# Patient Record
Sex: Female | Born: 1955 | Race: White | Hispanic: No | Marital: Married | Smoking: Never smoker
Health system: Southern US, Community
[De-identification: ages and names within clinical notes are randomized; demographics above are authoritative.]

## PROBLEM LIST (undated history)

## (undated) DIAGNOSIS — F329 Major depressive disorder, single episode, unspecified: Secondary | ICD-10-CM

## (undated) DIAGNOSIS — I2510 Atherosclerotic heart disease of native coronary artery without angina pectoris: Secondary | ICD-10-CM

## (undated) DIAGNOSIS — R030 Elevated blood-pressure reading, without diagnosis of hypertension: Secondary | ICD-10-CM

## (undated) DIAGNOSIS — E669 Obesity, unspecified: Secondary | ICD-10-CM

## (undated) DIAGNOSIS — F32A Depression, unspecified: Secondary | ICD-10-CM

## (undated) DIAGNOSIS — F419 Anxiety disorder, unspecified: Secondary | ICD-10-CM

## (undated) DIAGNOSIS — E785 Hyperlipidemia, unspecified: Secondary | ICD-10-CM

## (undated) DIAGNOSIS — K219 Gastro-esophageal reflux disease without esophagitis: Secondary | ICD-10-CM

## (undated) HISTORY — DX: Anxiety disorder, unspecified: F41.9

## (undated) HISTORY — PX: ABDOMINAL HYSTERECTOMY: SHX81

## (undated) HISTORY — DX: Gastro-esophageal reflux disease without esophagitis: K21.9

---

## 2000-10-23 ENCOUNTER — Ambulatory Visit (HOSPITAL_COMMUNITY): Admission: RE | Admit: 2000-10-23 | Discharge: 2000-10-23 | Payer: Self-pay | Admitting: *Deleted

## 2000-10-23 ENCOUNTER — Encounter: Payer: Self-pay | Admitting: *Deleted

## 2000-10-23 ENCOUNTER — Other Ambulatory Visit: Admission: RE | Admit: 2000-10-23 | Discharge: 2000-10-23 | Payer: Self-pay | Admitting: *Deleted

## 2002-02-12 ENCOUNTER — Ambulatory Visit (HOSPITAL_COMMUNITY): Admission: RE | Admit: 2002-02-12 | Discharge: 2002-02-12 | Payer: Self-pay | Admitting: *Deleted

## 2002-02-12 ENCOUNTER — Encounter: Payer: Self-pay | Admitting: *Deleted

## 2003-10-20 ENCOUNTER — Ambulatory Visit (HOSPITAL_COMMUNITY): Admission: RE | Admit: 2003-10-20 | Discharge: 2003-10-20 | Payer: Self-pay | Admitting: *Deleted

## 2004-10-24 ENCOUNTER — Ambulatory Visit (HOSPITAL_COMMUNITY): Admission: RE | Admit: 2004-10-24 | Discharge: 2004-10-24 | Payer: Self-pay | Admitting: *Deleted

## 2006-01-24 ENCOUNTER — Ambulatory Visit (HOSPITAL_COMMUNITY): Admission: RE | Admit: 2006-01-24 | Discharge: 2006-01-24 | Payer: Self-pay | Admitting: Obstetrics and Gynecology

## 2007-01-31 ENCOUNTER — Ambulatory Visit (HOSPITAL_COMMUNITY): Admission: RE | Admit: 2007-01-31 | Discharge: 2007-01-31 | Payer: Self-pay | Admitting: Obstetrics and Gynecology

## 2008-02-14 ENCOUNTER — Ambulatory Visit (HOSPITAL_COMMUNITY): Admission: RE | Admit: 2008-02-14 | Discharge: 2008-02-14 | Payer: Self-pay | Admitting: Obstetrics and Gynecology

## 2009-02-24 ENCOUNTER — Ambulatory Visit (HOSPITAL_COMMUNITY): Admission: RE | Admit: 2009-02-24 | Discharge: 2009-02-24 | Payer: Self-pay | Admitting: Obstetrics and Gynecology

## 2009-02-24 IMAGING — MG MM DIGITAL SCREENING
4 series · 4 of 4 positions shown · non-contrast
Comparison: none

DG SCREEN MAMMOGRAM BILATERAL
Bilateral CC and MLO view(s) were taken.

DIGITAL SCREENING MAMMOGRAM WITH CAD:
There are scattered fibroglandular densities.  No masses or malignant type calcifications are 
identified.  Compared with prior studies.
Images were processed with CAD.

[L CC]
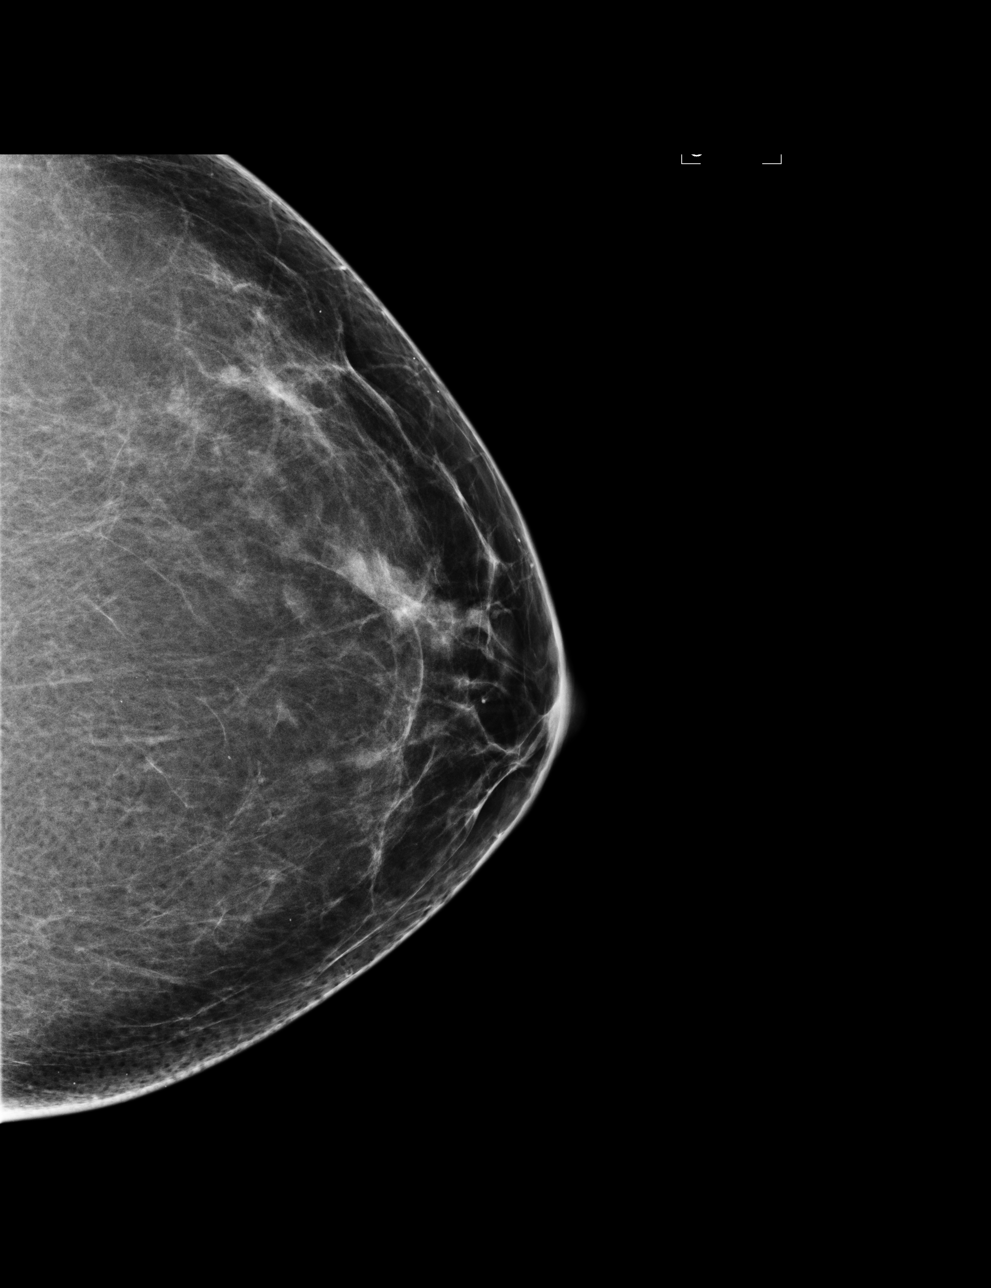

[L MLO]
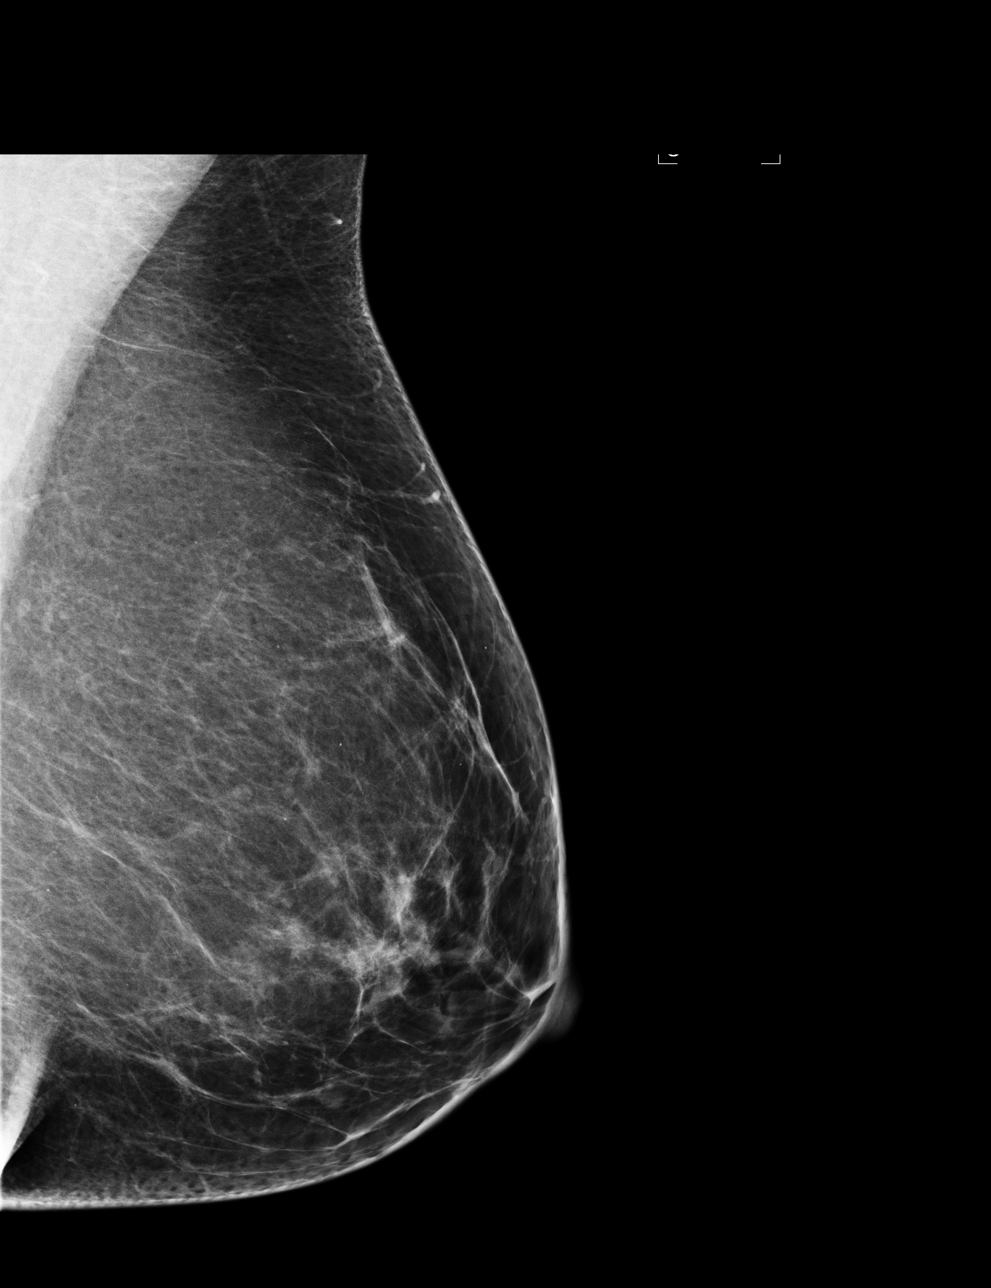

[R CC]
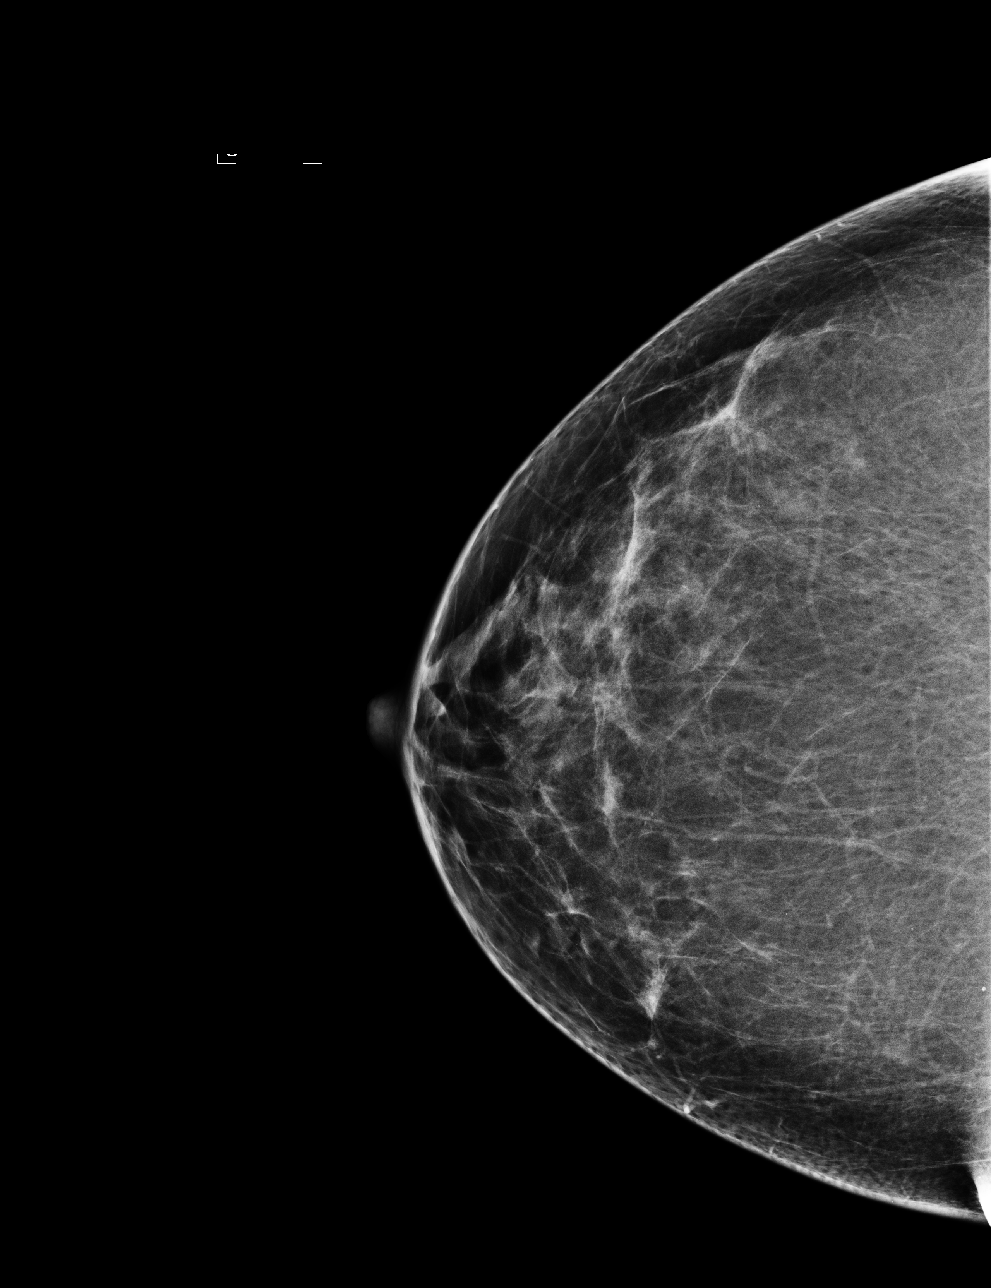

[R MLO]
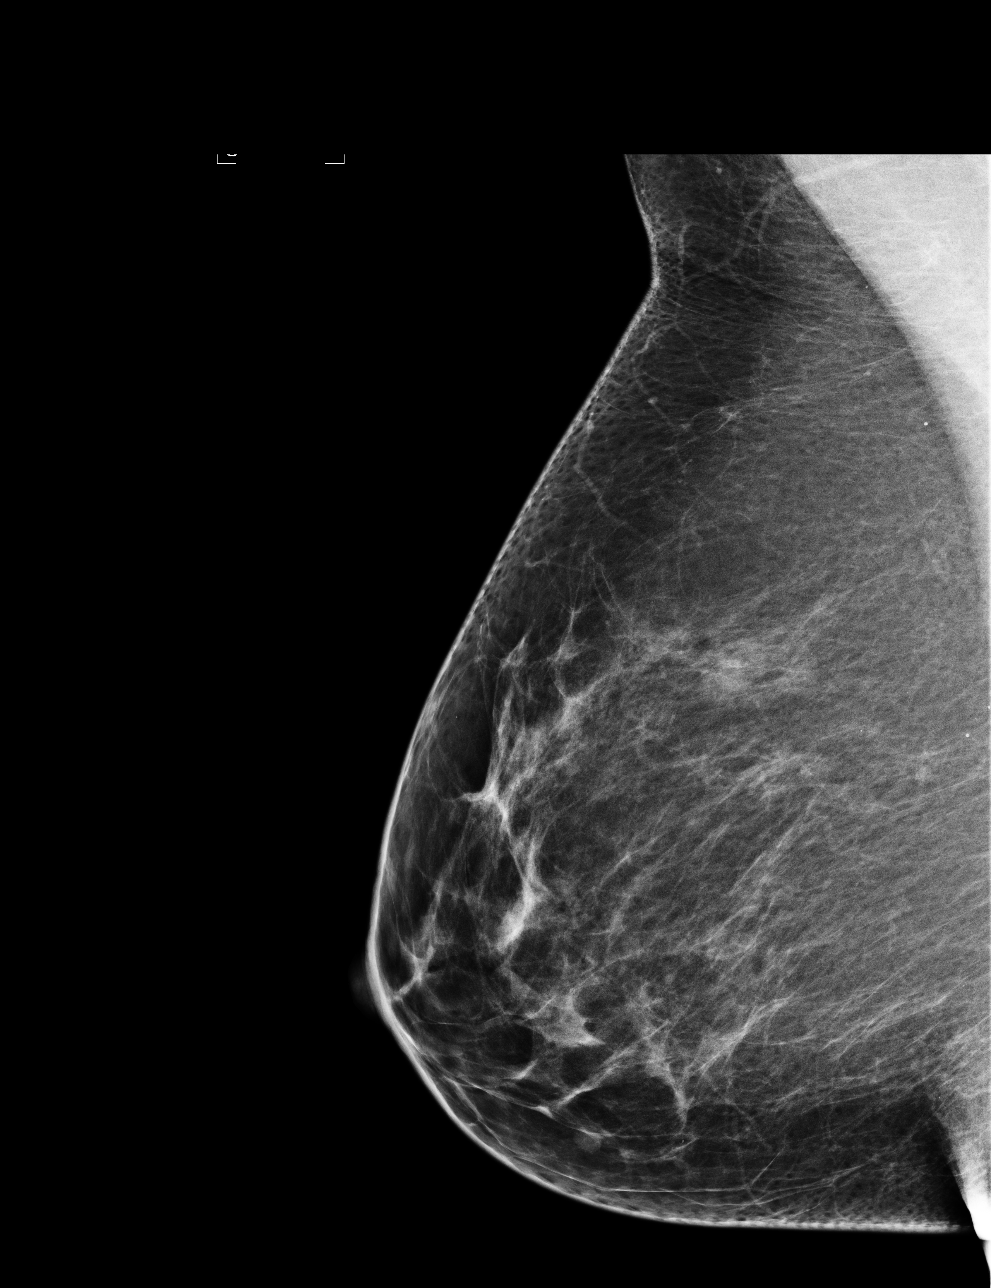

[4 of 4 positions shown; findings below may reference images not displayed]

IMPRESSION: No specific mammographic evidence of malignancy.  Next screening mammogram is recommended in one 
year.

A result letter of this screening mammogram will be mailed directly to the patient.

ASSESSMENT: Negative - BI-RADS 1

Screening mammogram in 1 year.
,

## 2010-03-04 ENCOUNTER — Ambulatory Visit (HOSPITAL_COMMUNITY)
Admission: RE | Admit: 2010-03-04 | Discharge: 2010-03-04 | Payer: Self-pay | Source: Home / Self Care | Attending: Obstetrics and Gynecology | Admitting: Obstetrics and Gynecology

## 2010-04-17 ENCOUNTER — Encounter: Payer: Self-pay | Admitting: Obstetrics and Gynecology

## 2011-01-23 ENCOUNTER — Other Ambulatory Visit (HOSPITAL_COMMUNITY): Payer: Self-pay | Admitting: Obstetrics and Gynecology

## 2011-01-23 DIAGNOSIS — Z139 Encounter for screening, unspecified: Secondary | ICD-10-CM

## 2011-03-09 ENCOUNTER — Ambulatory Visit (HOSPITAL_COMMUNITY)
Admission: RE | Admit: 2011-03-09 | Discharge: 2011-03-09 | Disposition: A | Discharge status: Home / Self Care | Payer: BC Managed Care – PPO | Source: Ambulatory Visit | Attending: Obstetrics and Gynecology | Admitting: Obstetrics and Gynecology

## 2011-03-09 DIAGNOSIS — Z139 Encounter for screening, unspecified: Secondary | ICD-10-CM

## 2011-03-09 DIAGNOSIS — Z1231 Encounter for screening mammogram for malignant neoplasm of breast: Secondary | ICD-10-CM | POA: Insufficient documentation

## 2011-07-05 ENCOUNTER — Encounter (HOSPITAL_COMMUNITY): Payer: Self-pay | Admitting: *Deleted

## 2011-07-05 ENCOUNTER — Emergency Department (HOSPITAL_COMMUNITY)
Admission: EM | Admit: 2011-07-05 | Discharge: 2011-07-05 | Disposition: A | Discharge status: Home / Self Care | Payer: BC Managed Care – PPO | Attending: Emergency Medicine | Admitting: Emergency Medicine

## 2011-07-05 DIAGNOSIS — M549 Dorsalgia, unspecified: Secondary | ICD-10-CM

## 2011-07-05 MED ORDER — HYDROCODONE-ACETAMINOPHEN 5-325 MG PO TABS: 1.0000 | ORAL_TABLET | Freq: Four times a day (QID) | ORAL | Status: AC | PRN

## 2011-07-05 NOTE — ED Provider Notes (Signed)
History     CSN: [id number]  Arrival date & time 07/05/11  1402   First MD Initiated Contact with Patient 07/05/11 1438      Chief Complaint  Patient presents with  . Back Pain    (Consider location/radiation/quality/duration/timing/severity/associated sxs/prior treatment) Patient is a 56 y.o. female presenting with back pain. The history is provided by the patient.  Back Pain  This is a new problem. The current episode started more than 2 days ago. The problem occurs constantly. The problem has not changed since onset.The pain is associated with no known injury. The pain is present in the sacro-iliac joint. The quality of the pain is described as stabbing. The pain does not radiate. The pain is at a severity of 5/10. The pain is moderate. The symptoms are aggravated by bending and certain positions. Pertinent negatives include no chest pain, no fever, no numbness, no headaches, no abdominal pain, no bowel incontinence, no perianal numbness, no bladder incontinence, no dysuria, no leg pain, no paresthesias, no paresis, no tingling and no weakness.   Patient with history of similar back problems in the past as a receiving her primary care provider and treated with a prednisone and Flexeril and currently now taking Motrin at their recommendation. Patient has not had an MRI or x-rays of the back in the past with symptoms of only been present for about one week.    History reviewed. No pertinent past medical history.  Past Surgical History  Procedure Date  . Abdominal hysterectomy     History reviewed. No pertinent family history.  History  Substance Use Topics  . Smoking status: Never Smoker   . Smokeless tobacco: Not on file  . Alcohol Use: No    OB History    Grav Para Term Preterm Abortions TAB SAB Ect Mult Living                  Review of Systems  Constitutional: Negative for fever.  HENT: Negative for neck pain.   Eyes: Negative for visual disturbance.    Respiratory: Negative for shortness of breath.   Cardiovascular: Negative for chest pain.  Gastrointestinal: Negative for nausea, vomiting, abdominal pain and bowel incontinence.  Genitourinary: Negative for bladder incontinence and dysuria.  Musculoskeletal: Positive for back pain.  Skin: Negative for rash.  Neurological: Negative for tingling, weakness, numbness, headaches and paresthesias.  Hematological: Does not bruise/bleed easily.    Allergies  Review of patient's allergies indicates no known allergies.  Home Medications   Current Outpatient Rx  Name Route Sig Dispense Refill  . CITALOPRAM HYDROBROMIDE 20 MG PO TABS Oral Take 20 mg by mouth at bedtime.    . CYCLOBENZAPRINE HCL 10 MG PO TABS Oral Take 10 mg by mouth 3 (three) times daily as needed. Muscle Spasms    . IBUPROFEN 800 MG PO TABS Oral Take 800 mg by mouth every 8 (eight) hours as needed. Pain    . NIACIN PO Oral Take 1 tablet by mouth daily.    Marland Kitchen FISH OIL 1000 MG PO CAPS Oral Take 2,000 mg by mouth daily.    Marland Kitchen SIMVASTATIN 20 MG PO TABS Oral Take 20 mg by mouth every evening.    Marland Kitchen HYDROCODONE-ACETAMINOPHEN 5-325 MG PO TABS Oral Take 1-2 tablets by mouth every 6 (six) hours as needed for pain. 14 tablet 0    BP 109/73  Pulse 90  Temp(Src) 97.3 F (36.3 C) (Oral)  Resp 20  Ht 5\' 4"  (1.626 m)  Wt 170 lb (77.111 kg)  BMI 29.18 kg/m2  SpO2 99%  Physical Exam  Nursing note and vitals reviewed. Constitutional: She is oriented to person, place, and time. She appears well-developed and well-nourished. No distress.  HENT:  Head: Normocephalic and atraumatic.  Mouth/Throat: Oropharynx is clear and moist.  Eyes: Conjunctivae and EOM are normal. Pupils are equal, round, and reactive to light.  Neck: Normal range of motion. Neck supple.  Cardiovascular: Normal rate, regular rhythm and normal heart sounds.   No murmur heard. Pulmonary/Chest: Effort normal and breath sounds normal. No respiratory distress.   Abdominal: Soft. Bowel sounds are normal. There is no tenderness.  Musculoskeletal: Normal range of motion. She exhibits no edema and no tenderness.  Neurological: She is alert and oriented to person, place, and time. No cranial nerve deficit. She exhibits normal muscle tone. Coordination normal.  Skin: Skin is warm. No rash noted. She is not diaphoretic.    ED Course  Procedures (including critical care time)  Labs Reviewed - No data to display No results found.   1. Back pain       MDM  Symptoms seem to be consistent with musculoskeletal right-sided low back pain no neuro or focal deficits. Patient started and treated with prednisone currently taking Motrin and Flexeril. Symptoms not resolving has had similar symptoms in the past that have resolved with prednisone. Will add hydrocodone as needed for pain relief. Patient will follow up with primary care provider if symptoms not improving mostly concerned because it daughter has a wedding in 2 weeks and wants to be better recommend she be off her feet and rest as much is possible this week. Minimize any work as much as possible.        Shelda Jakes, MD 07/05/11 [phone]

## 2011-07-05 NOTE — ED Notes (Signed)
Pain rt side of back, seen by MD and given meds but no better.

## 2011-07-05 NOTE — Discharge Instructions (Signed)
Take pain medicine as directed. May take this along with your Flexeril and Motrin. Rest as much as possible. Followup with your primary care doctor in the next few days if not better. Return for any new or worse symptoms.   Back Pain, Adult Low back pain is very common. About 1 in 5 people have back pain.The cause of low back pain is rarely dangerous. The pain often gets better over time.About half of people with a sudden onset of back pain feel better in just 2 weeks. About 8 in 10 people feel better by 6 weeks.  CAUSES Some common causes of back pain include:  Strain of the muscles or ligaments supporting the spine.   Wear and tear (degeneration) of the spinal discs.   Arthritis.   Direct injury to the back.  DIAGNOSIS Most of the time, the direct cause of low back pain is not known.However, back pain can be treated effectively even when the exact cause of the pain is unknown.Answering your caregiver's questions about your overall health and symptoms is one of the most accurate ways to make sure the cause of your pain is not dangerous. If your caregiver needs more information, he or she may order lab work or imaging tests (X-rays or MRIs).However, even if imaging tests show changes in your back, this usually does not require surgery. HOME CARE INSTRUCTIONS For many people, back pain returns.Since low back pain is rarely dangerous, it is often a condition that people can learn to [facility] their own.   Remain active. It is stressful on the back to sit or stand in one place. Do not sit, drive, or stand in one place for more than 30 minutes at a time. Take short walks on level surfaces as soon as pain allows.Try to increase the length of time you walk each day.   Do not stay in bed.Resting more than 1 or 2 days can delay your recovery.   Do not avoid exercise or work.Your body is made to move.It is not dangerous to be active, even though your back may hurt.Your back will likely heal  faster if you return to being active before your pain is gone.   Pay attention to your body when you bend and lift. Many people have less discomfortwhen lifting if they bend their knees, keep the load close to their bodies,and avoid twisting. Often, the most comfortable positions are those that put less stress on your recovering back.   Find a comfortable position to sleep. Use a firm mattress and lie on your side with your knees slightly bent. If you lie on your back, put a pillow under your knees.   Only take over-the-counter or prescription medicines as directed by your caregiver. Over-the-counter medicines to reduce pain and inflammation are often the most helpful.Your caregiver may prescribe muscle relaxant drugs.These medicines help dull your pain so you can more quickly return to your normal activities and healthy exercise.   Put ice on the injured area.   Put ice in a plastic bag.   Place a towel between your skin and the bag.   Leave the ice on for 15 to 20 minutes, 3 to 4 times a day for the first 2 to 3 days. After that, ice and heat may be alternated to reduce pain and spasms.   Ask your caregiver about trying back exercises and gentle massage. This may be of some benefit.   Avoid feeling anxious or stressed.Stress increases muscle tension and can worsen back  pain.It is important to recognize when you are anxious or stressed and learn ways to manage it.Exercise is a great option.  SEEK MEDICAL CARE IF:  You have pain that is not relieved with rest or medicine.   You have pain that does not improve in 1 week.   You have new symptoms.   You are generally not feeling well.  SEEK IMMEDIATE MEDICAL CARE IF:   You have pain that radiates from your back into your legs.   You develop new bowel or bladder control problems.   You have unusual weakness or numbness in your arms or legs.   You develop nausea or vomiting.   You develop abdominal pain.   You feel faint.    Document Released: 03/13/2005 Document Revised: 03/02/2011 Document Reviewed: 08/01/2010 [facility] Patient Information 2012 [place], [place].

## 2011-07-05 NOTE — ED Notes (Signed)
Pain rt side low back.  Has seen Dr Sherwood Gambler  And treated, but no better.

## 2011-07-11 ENCOUNTER — Ambulatory Visit (HOSPITAL_COMMUNITY)
Admission: RE | Admit: 2011-07-11 | Discharge: 2011-07-11 | Disposition: A | Discharge status: Home / Self Care | Payer: BC Managed Care – PPO | Source: Ambulatory Visit | Attending: Family Medicine | Admitting: Family Medicine

## 2011-07-11 ENCOUNTER — Other Ambulatory Visit (HOSPITAL_COMMUNITY): Payer: Self-pay | Admitting: Family Medicine

## 2011-07-11 DIAGNOSIS — X58XXXA Exposure to other specified factors, initial encounter: Secondary | ICD-10-CM | POA: Insufficient documentation

## 2011-07-11 DIAGNOSIS — M545 Low back pain, unspecified: Secondary | ICD-10-CM | POA: Insufficient documentation

## 2011-07-11 DIAGNOSIS — M5137 Other intervertebral disc degeneration, lumbosacral region: Secondary | ICD-10-CM | POA: Insufficient documentation

## 2011-07-11 DIAGNOSIS — S335XXA Sprain of ligaments of lumbar spine, initial encounter: Secondary | ICD-10-CM | POA: Insufficient documentation

## 2011-07-11 DIAGNOSIS — M51379 Other intervertebral disc degeneration, lumbosacral region without mention of lumbar back pain or lower extremity pain: Secondary | ICD-10-CM | POA: Insufficient documentation

## 2011-07-11 IMAGING — CR DG LUMBAR SPINE 2-3V
3 series · 3 of 3 positions shown · non-contrast
Comparison: None.

CLINICAL DATA: Right lower back pain

LUMBAR SPINE - 2-3 VIEW

[view not recorded (1 of 3)]
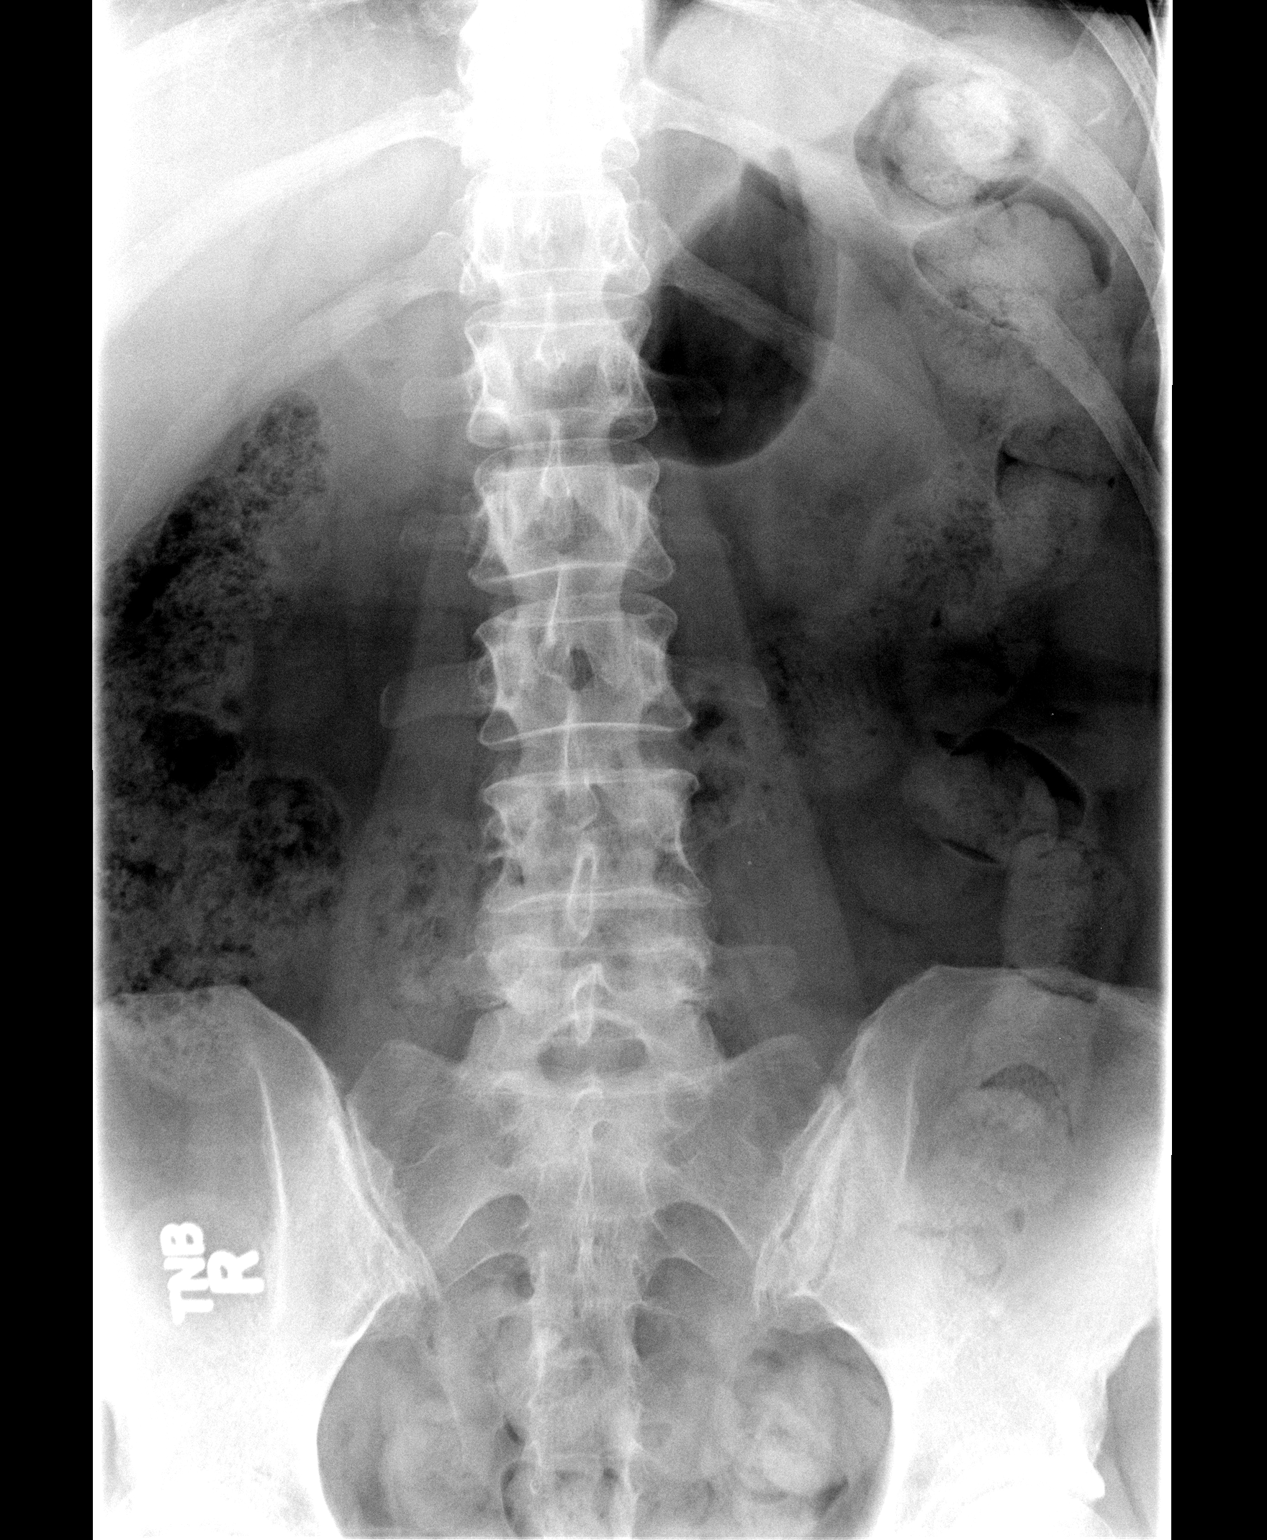

[view not recorded (2 of 3)]
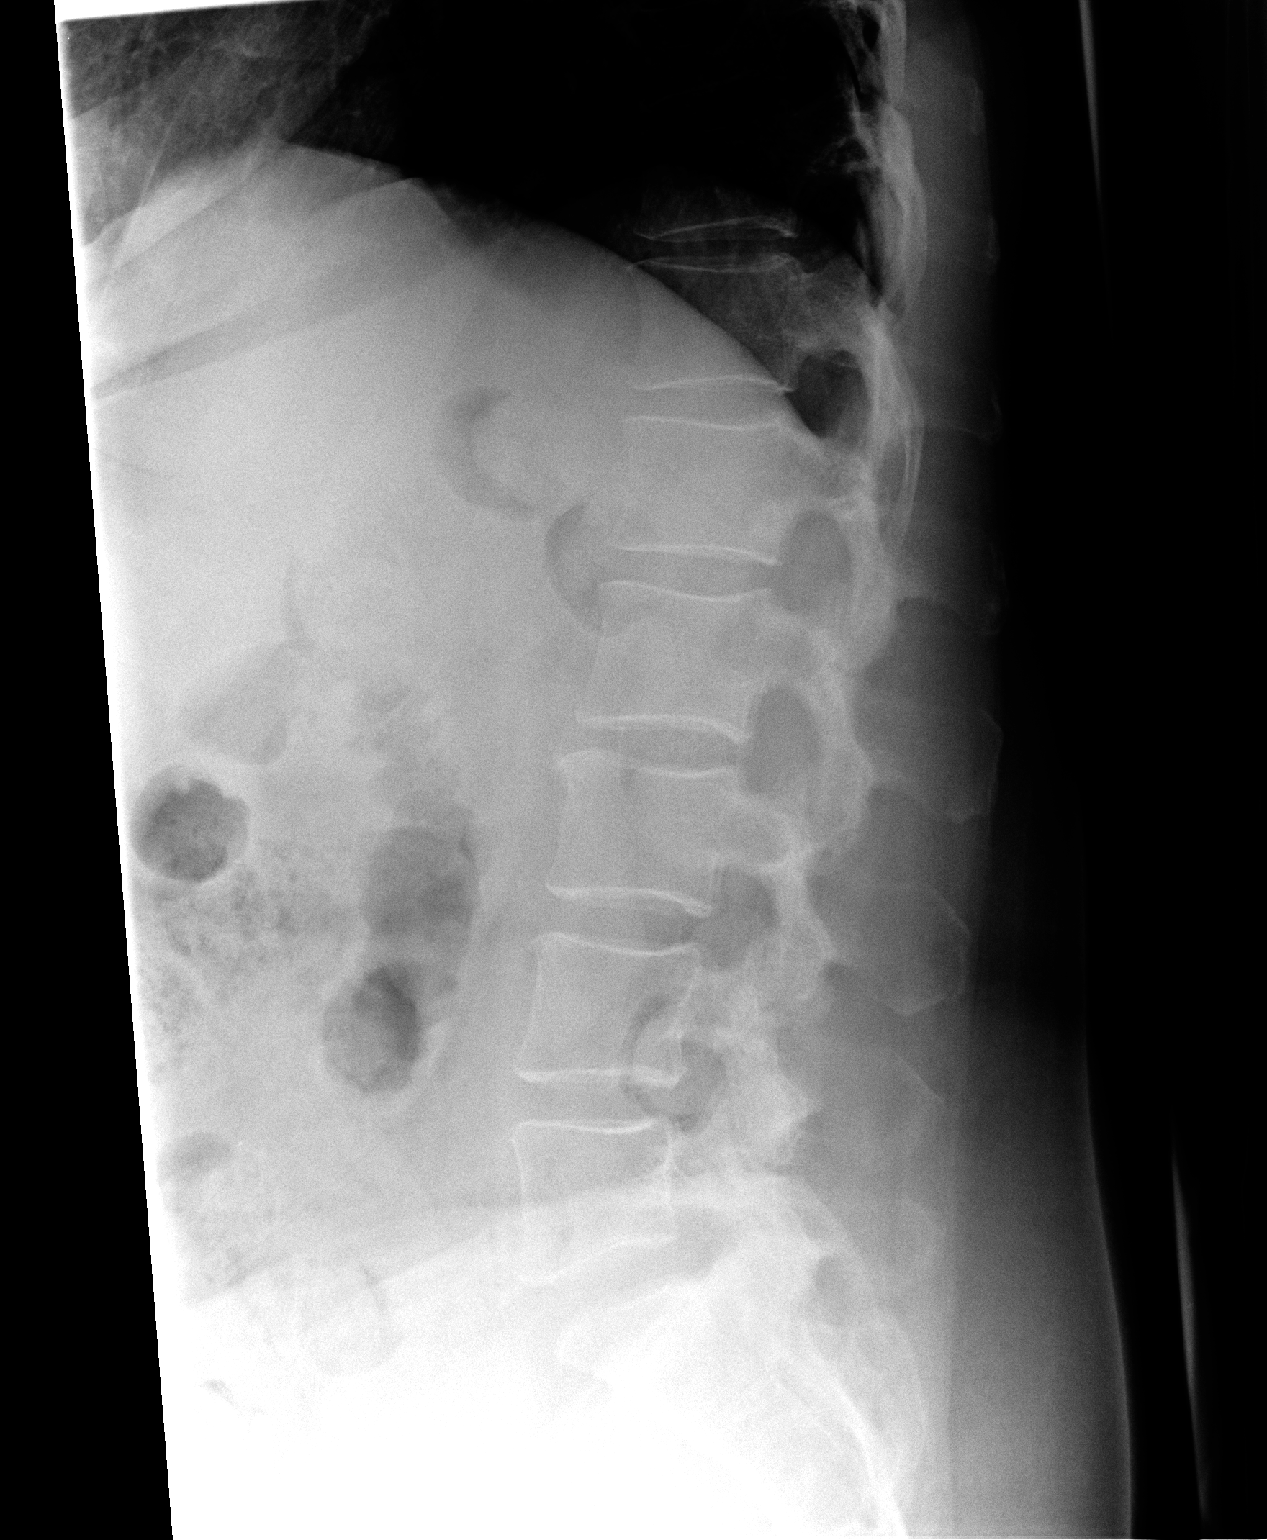

[view not recorded (3 of 3)]
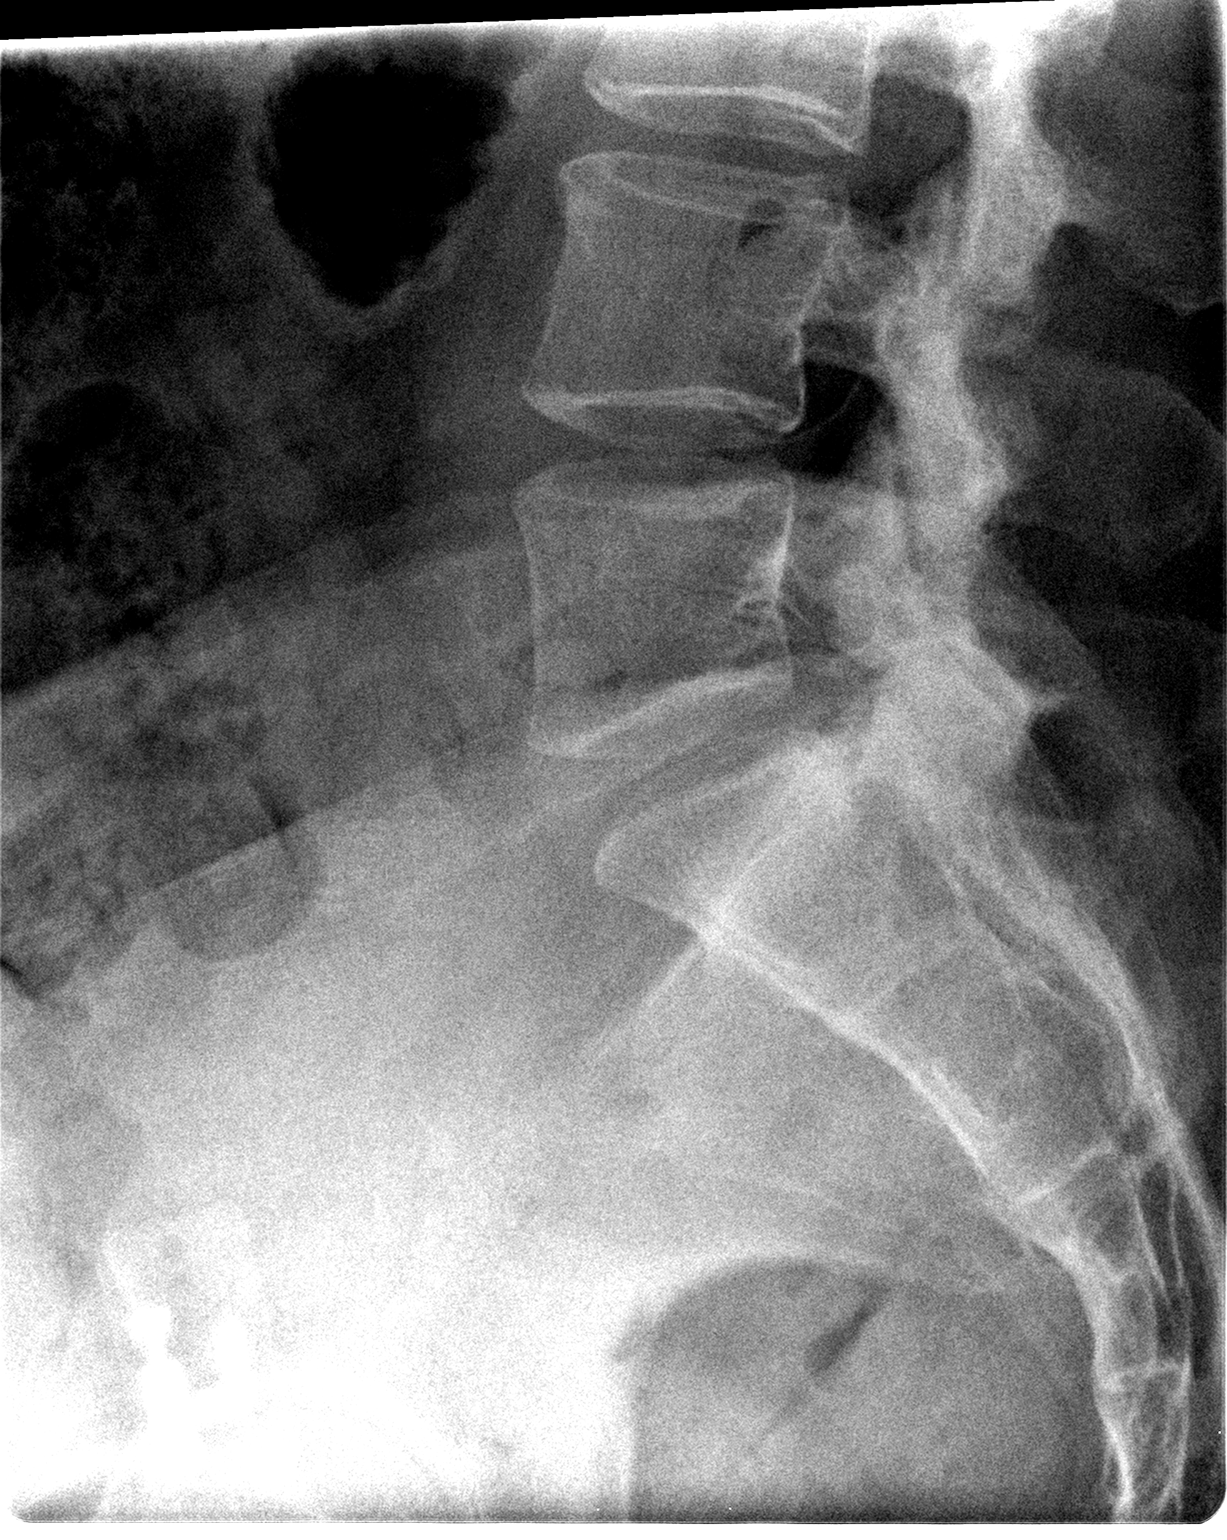

[3 of 3 positions shown; findings below may reference images not displayed]

FINDINGS: There are five non-rib bearing lumbar type vertebral
bodies.  There is mild scoliotic curvature of the thoracolumbar
spine, possibly positional.  No anterolisthesis or retrolisthesis.
Vertebral body heights are preserved.  There is mild DDD of L2 - L3
with mild disc space height loss, end plate irregularity and
primarily anteriorly directed osteophytosis.  Limited visualization
of the bilateral SI joint is normal.  Regional bowel gas pattern is
normal.
IMPRESSION: Mild DDD of L2 - L3.

## 2012-01-30 ENCOUNTER — Other Ambulatory Visit (HOSPITAL_COMMUNITY): Payer: Self-pay | Admitting: Physician Assistant

## 2012-01-30 DIAGNOSIS — Z139 Encounter for screening, unspecified: Secondary | ICD-10-CM

## 2012-03-12 ENCOUNTER — Ambulatory Visit (HOSPITAL_COMMUNITY): Payer: BC Managed Care – PPO

## 2012-03-21 ENCOUNTER — Ambulatory Visit (HOSPITAL_COMMUNITY)
Admission: RE | Admit: 2012-03-21 | Discharge: 2012-03-21 | Disposition: A | Discharge status: Home / Self Care | Payer: BC Managed Care – PPO | Source: Ambulatory Visit | Attending: Physician Assistant | Admitting: Physician Assistant

## 2012-03-21 DIAGNOSIS — Z1231 Encounter for screening mammogram for malignant neoplasm of breast: Secondary | ICD-10-CM | POA: Insufficient documentation

## 2012-03-21 DIAGNOSIS — Z139 Encounter for screening, unspecified: Secondary | ICD-10-CM

## 2013-03-24 ENCOUNTER — Other Ambulatory Visit (HOSPITAL_COMMUNITY): Payer: Self-pay | Admitting: Obstetrics and Gynecology

## 2013-03-24 DIAGNOSIS — Z139 Encounter for screening, unspecified: Secondary | ICD-10-CM

## 2013-04-03 ENCOUNTER — Ambulatory Visit (HOSPITAL_COMMUNITY)
Admission: RE | Admit: 2013-04-03 | Discharge: 2013-04-03 | Disposition: A | Discharge status: Home / Self Care | Payer: BC Managed Care – PPO | Source: Ambulatory Visit | Attending: Obstetrics and Gynecology | Admitting: Obstetrics and Gynecology

## 2013-04-03 DIAGNOSIS — Z1231 Encounter for screening mammogram for malignant neoplasm of breast: Secondary | ICD-10-CM | POA: Insufficient documentation

## 2013-04-03 DIAGNOSIS — Z139 Encounter for screening, unspecified: Secondary | ICD-10-CM

## 2014-04-02 ENCOUNTER — Other Ambulatory Visit (HOSPITAL_COMMUNITY): Payer: Self-pay | Admitting: Obstetrics and Gynecology

## 2014-04-02 DIAGNOSIS — Z1231 Encounter for screening mammogram for malignant neoplasm of breast: Secondary | ICD-10-CM

## 2014-04-17 ENCOUNTER — Ambulatory Visit (HOSPITAL_COMMUNITY): Payer: BC Managed Care – PPO

## 2014-05-08 ENCOUNTER — Ambulatory Visit (HOSPITAL_COMMUNITY)
Admission: RE | Admit: 2014-05-08 | Discharge: 2014-05-08 | Disposition: A | Discharge status: Home / Self Care | Payer: BC Managed Care – PPO | Source: Ambulatory Visit | Attending: Obstetrics and Gynecology | Admitting: Obstetrics and Gynecology

## 2014-05-08 DIAGNOSIS — Z1231 Encounter for screening mammogram for malignant neoplasm of breast: Secondary | ICD-10-CM | POA: Diagnosis present

## 2014-07-06 ENCOUNTER — Other Ambulatory Visit (HOSPITAL_COMMUNITY): Payer: Self-pay | Admitting: Family Medicine

## 2014-07-06 ENCOUNTER — Ambulatory Visit (HOSPITAL_COMMUNITY)
Admission: RE | Admit: 2014-07-06 | Discharge: 2014-07-06 | Disposition: A | Discharge status: Home / Self Care | Payer: BC Managed Care – PPO | Source: Ambulatory Visit | Attending: Family Medicine | Admitting: Family Medicine

## 2014-07-06 DIAGNOSIS — Y939 Activity, unspecified: Secondary | ICD-10-CM | POA: Diagnosis not present

## 2014-07-06 DIAGNOSIS — S82831A Other fracture of upper and lower end of right fibula, initial encounter for closed fracture: Secondary | ICD-10-CM | POA: Insufficient documentation

## 2014-07-06 DIAGNOSIS — S93401A Sprain of unspecified ligament of right ankle, initial encounter: Secondary | ICD-10-CM

## 2014-07-06 DIAGNOSIS — M25471 Effusion, right ankle: Secondary | ICD-10-CM | POA: Insufficient documentation

## 2014-07-06 DIAGNOSIS — M25571 Pain in right ankle and joints of right foot: Secondary | ICD-10-CM | POA: Diagnosis present

## 2014-07-06 IMAGING — CR DG ANKLE COMPLETE 3+V*R*
3 series · 3 of 3 positions shown · non-contrast
Comparison: None.

CLINICAL DATA: Right ankle pain post twisting injury yesterday

EXAM:
RIGHT ANKLE - COMPLETE 3+ VIEW

[view not recorded (1 of 3)]
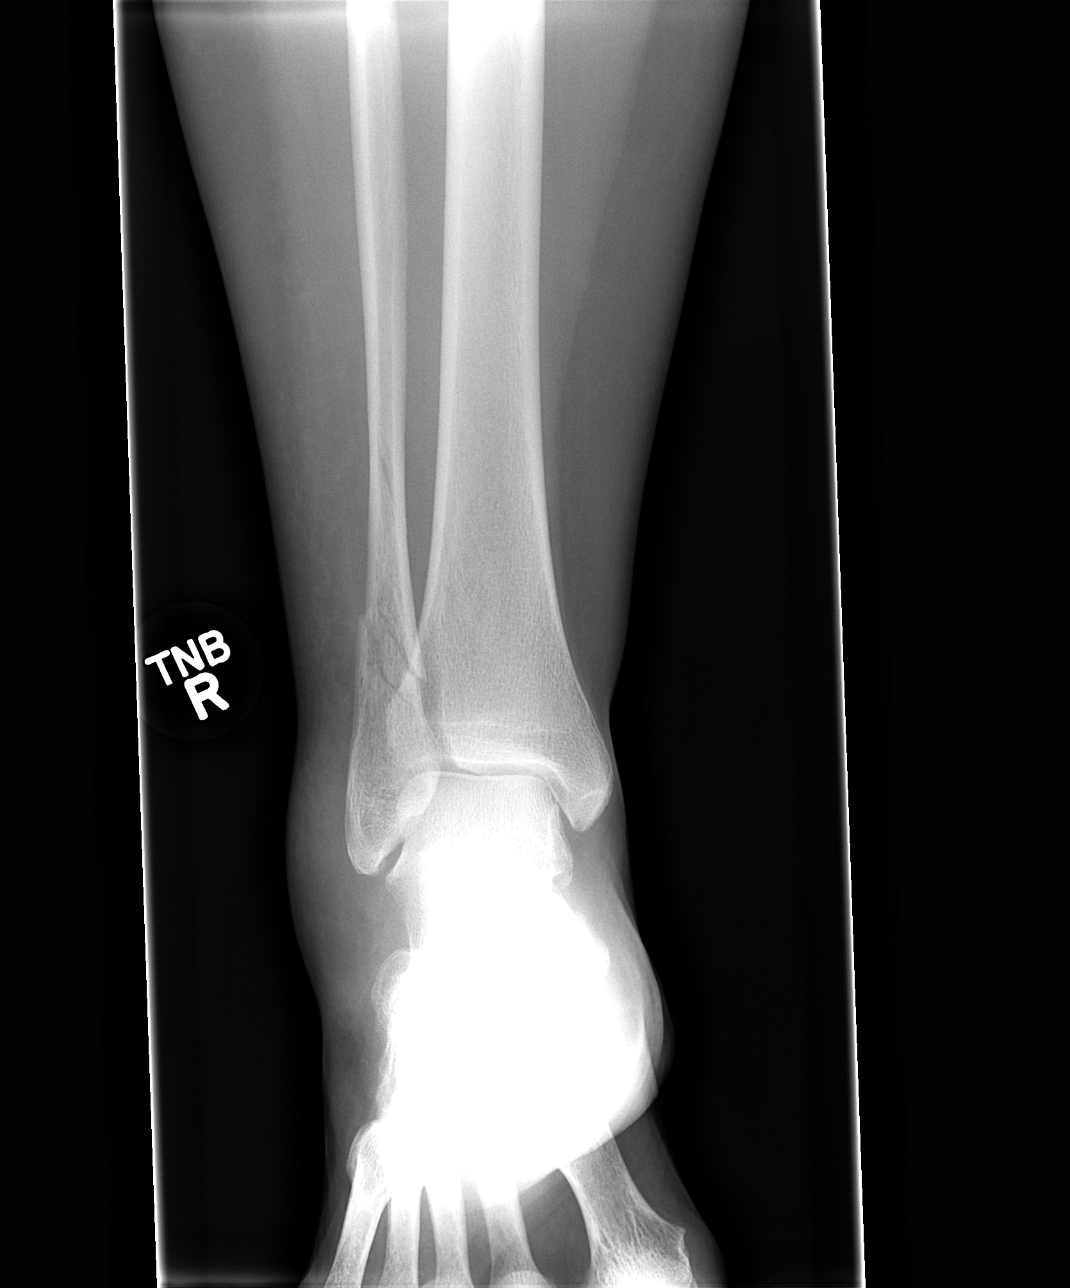

[view not recorded (2 of 3)]
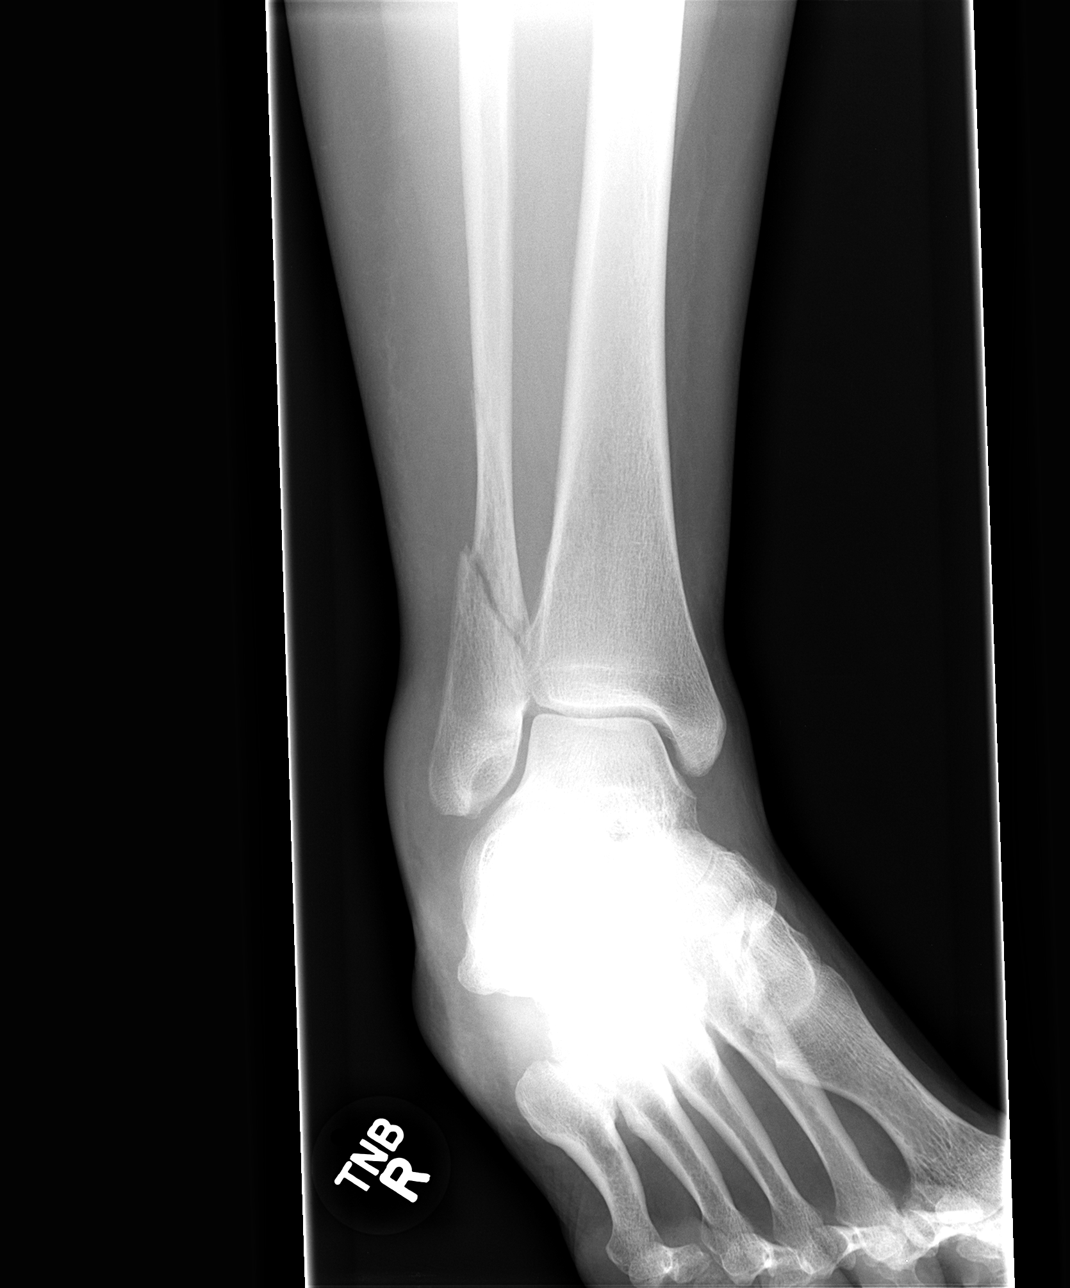

[view not recorded (3 of 3)]
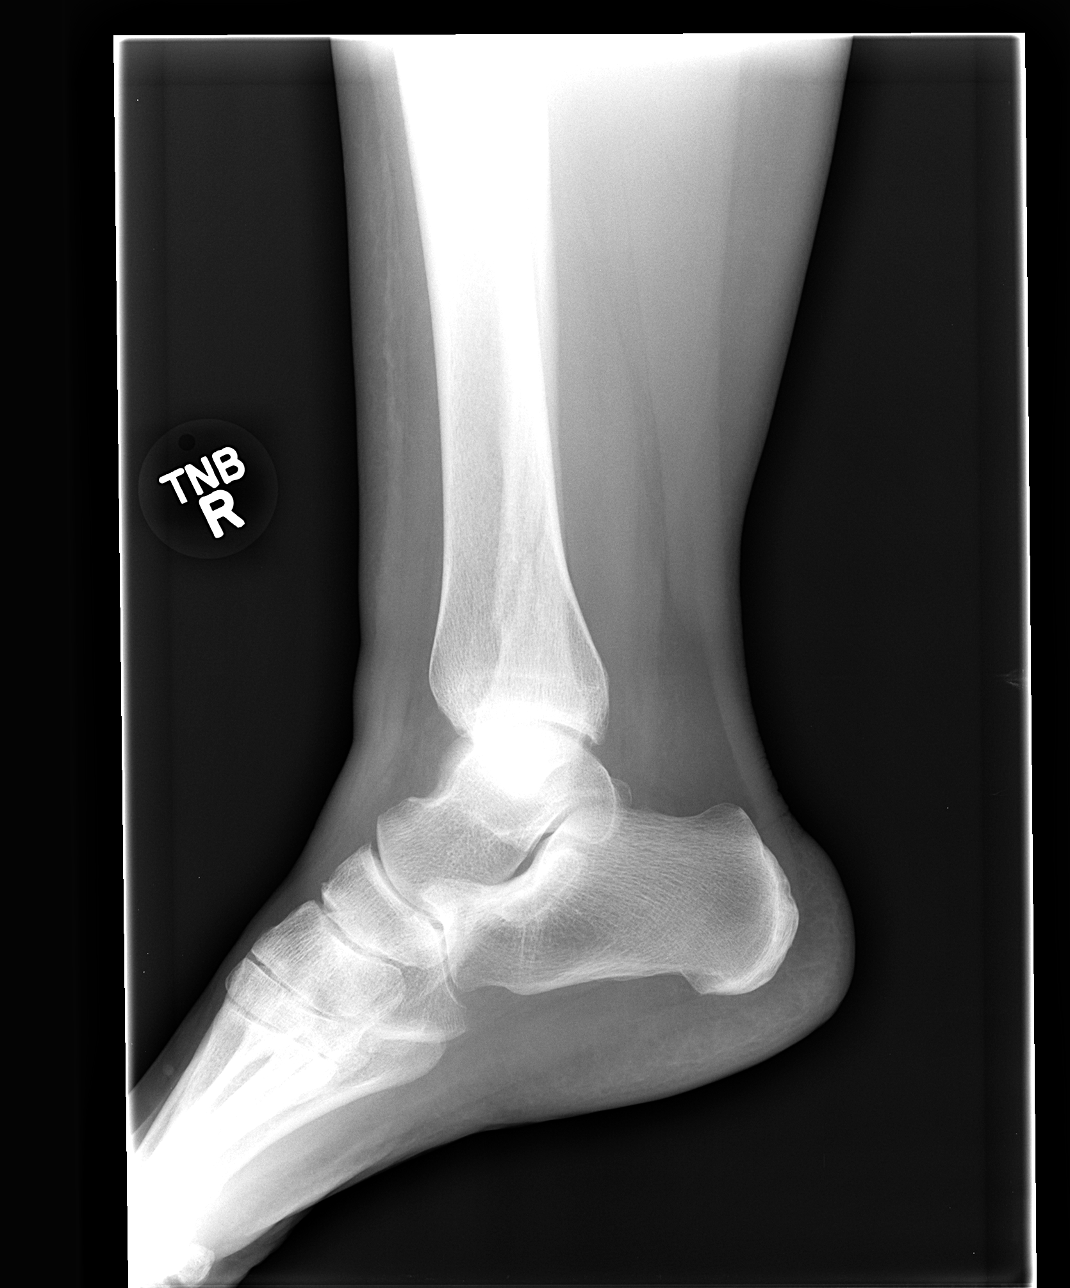

[3 of 3 positions shown; findings below may reference images not displayed]

FINDINGS: Three views of the right ankle submitted. There is oblique mild
displaced fracture in distal fibula. A second nondisplaced fracture
line is noted in distal fibula. Soft tissue swelling adjacent to
lateral malleolus. Ankle mortise is preserved.
IMPRESSION: There is oblique mild displaced fracture in distal fibula. A second
nondisplaced fracture line is noted in distal fibula. Soft tissue
swelling adjacent to lateral malleolus. Ankle mortise is preserved.

## 2015-04-21 ENCOUNTER — Other Ambulatory Visit (HOSPITAL_COMMUNITY): Payer: Self-pay | Admitting: Family Medicine

## 2015-04-21 DIAGNOSIS — Z1231 Encounter for screening mammogram for malignant neoplasm of breast: Secondary | ICD-10-CM

## 2015-05-14 ENCOUNTER — Ambulatory Visit (HOSPITAL_COMMUNITY)
Admission: RE | Admit: 2015-05-14 | Discharge: 2015-05-14 | Disposition: A | Discharge status: Home / Self Care | Payer: BC Managed Care – PPO | Source: Ambulatory Visit | Attending: Family Medicine | Admitting: Family Medicine

## 2015-05-14 ENCOUNTER — Ambulatory Visit (HOSPITAL_COMMUNITY): Payer: BC Managed Care – PPO

## 2015-05-14 DIAGNOSIS — Z1231 Encounter for screening mammogram for malignant neoplasm of breast: Secondary | ICD-10-CM | POA: Insufficient documentation

## 2016-04-07 ENCOUNTER — Other Ambulatory Visit (HOSPITAL_COMMUNITY): Payer: Self-pay | Admitting: Obstetrics and Gynecology

## 2016-04-07 DIAGNOSIS — Z1231 Encounter for screening mammogram for malignant neoplasm of breast: Secondary | ICD-10-CM

## 2016-05-15 ENCOUNTER — Ambulatory Visit (HOSPITAL_COMMUNITY)
Admission: RE | Admit: 2016-05-15 | Discharge: 2016-05-15 | Disposition: A | Discharge status: Home / Self Care | Payer: BC Managed Care – PPO | Source: Ambulatory Visit | Attending: Obstetrics and Gynecology | Admitting: Obstetrics and Gynecology

## 2016-05-15 DIAGNOSIS — Z1231 Encounter for screening mammogram for malignant neoplasm of breast: Secondary | ICD-10-CM | POA: Insufficient documentation

## 2016-05-19 ENCOUNTER — Ambulatory Visit (HOSPITAL_COMMUNITY): Payer: BC Managed Care – PPO

## 2016-12-02 ENCOUNTER — Encounter (HOSPITAL_COMMUNITY): Payer: Self-pay | Admitting: Emergency Medicine

## 2016-12-02 ENCOUNTER — Observation Stay (HOSPITAL_COMMUNITY)
Admission: EM | Admit: 2016-12-02 | Discharge: 2016-12-04 | Disposition: A | Discharge status: Home / Self Care | Payer: BC Managed Care – PPO | Attending: Cardiovascular Disease | Admitting: Cardiovascular Disease

## 2016-12-02 ENCOUNTER — Emergency Department (HOSPITAL_COMMUNITY): Payer: BC Managed Care – PPO

## 2016-12-02 DIAGNOSIS — Z6834 Body mass index (BMI) 34.0-34.9, adult: Secondary | ICD-10-CM | POA: Insufficient documentation

## 2016-12-02 DIAGNOSIS — I2510 Atherosclerotic heart disease of native coronary artery without angina pectoris: Secondary | ICD-10-CM | POA: Diagnosis not present

## 2016-12-02 DIAGNOSIS — R0789 Other chest pain: Principal | ICD-10-CM | POA: Insufficient documentation

## 2016-12-02 DIAGNOSIS — Z7982 Long term (current) use of aspirin: Secondary | ICD-10-CM | POA: Insufficient documentation

## 2016-12-02 DIAGNOSIS — Z8249 Family history of ischemic heart disease and other diseases of the circulatory system: Secondary | ICD-10-CM | POA: Diagnosis not present

## 2016-12-02 DIAGNOSIS — M79602 Pain in left arm: Secondary | ICD-10-CM | POA: Insufficient documentation

## 2016-12-02 DIAGNOSIS — E669 Obesity, unspecified: Secondary | ICD-10-CM | POA: Diagnosis not present

## 2016-12-02 DIAGNOSIS — E6609 Other obesity due to excess calories: Secondary | ICD-10-CM

## 2016-12-02 DIAGNOSIS — R030 Elevated blood-pressure reading, without diagnosis of hypertension: Secondary | ICD-10-CM

## 2016-12-02 DIAGNOSIS — F329 Major depressive disorder, single episode, unspecified: Secondary | ICD-10-CM | POA: Insufficient documentation

## 2016-12-02 DIAGNOSIS — R079 Chest pain, unspecified: Secondary | ICD-10-CM | POA: Insufficient documentation

## 2016-12-02 DIAGNOSIS — E782 Mixed hyperlipidemia: Secondary | ICD-10-CM | POA: Diagnosis present

## 2016-12-02 DIAGNOSIS — E785 Hyperlipidemia, unspecified: Secondary | ICD-10-CM | POA: Diagnosis not present

## 2016-12-02 HISTORY — DX: Major depressive disorder, single episode, unspecified: F32.9

## 2016-12-02 HISTORY — DX: Atherosclerotic heart disease of native coronary artery without angina pectoris: I25.10

## 2016-12-02 HISTORY — DX: Elevated blood-pressure reading, without diagnosis of hypertension: R03.0

## 2016-12-02 HISTORY — DX: Obesity, unspecified: E66.9

## 2016-12-02 HISTORY — DX: Hyperlipidemia, unspecified: E78.5

## 2016-12-02 HISTORY — DX: Depression, unspecified: F32.A

## 2016-12-02 LAB — CBC
HCT: 38.2 % (ref 36.0–46.0)
HEMOGLOBIN: 12.7 g/dL (ref 12.0–15.0)
MCH: 30.1 pg (ref 26.0–34.0)
MCHC: 33.2 g/dL (ref 30.0–36.0)
MCV: 90.5 fL (ref 78.0–100.0)
Platelets: 265 10*3/uL (ref 150–400)
RBC: 4.22 MIL/uL (ref 3.87–5.11)
RDW: 14 % (ref 11.5–15.5)
WBC: 7.9 10*3/uL (ref 4.0–10.5)

## 2016-12-02 LAB — BASIC METABOLIC PANEL
ANION GAP: 7 (ref 5–15)
BUN: 16 mg/dL (ref 6–20)
CALCIUM: 8.6 mg/dL — AB (ref 8.9–10.3)
CO2: 27 mmol/L (ref 22–32)
CREATININE: 0.84 mg/dL (ref 0.44–1.00)
Chloride: 107 mmol/L (ref 101–111)
GFR calc Af Amer: >60 mL/min (ref >60)
GLUCOSE: 134 mg/dL — AB (ref 65–99)
Potassium: 3.6 mmol/L (ref 3.5–5.1)
Sodium: 141 mmol/L (ref 135–145)

## 2016-12-02 LAB — TROPONIN I: Troponin I: <0.03 ng/mL (ref <0.03)

## 2016-12-02 IMAGING — DX DG CHEST 2V
2 series · 2 of 2 positions shown · non-contrast
Comparison: None.

CLINICAL DATA: Intermittent chest pain starting on [REDACTED] and
worsening today. Radiation to the left jaw and back.

EXAM:
CHEST  2 VIEW

[chest pa]
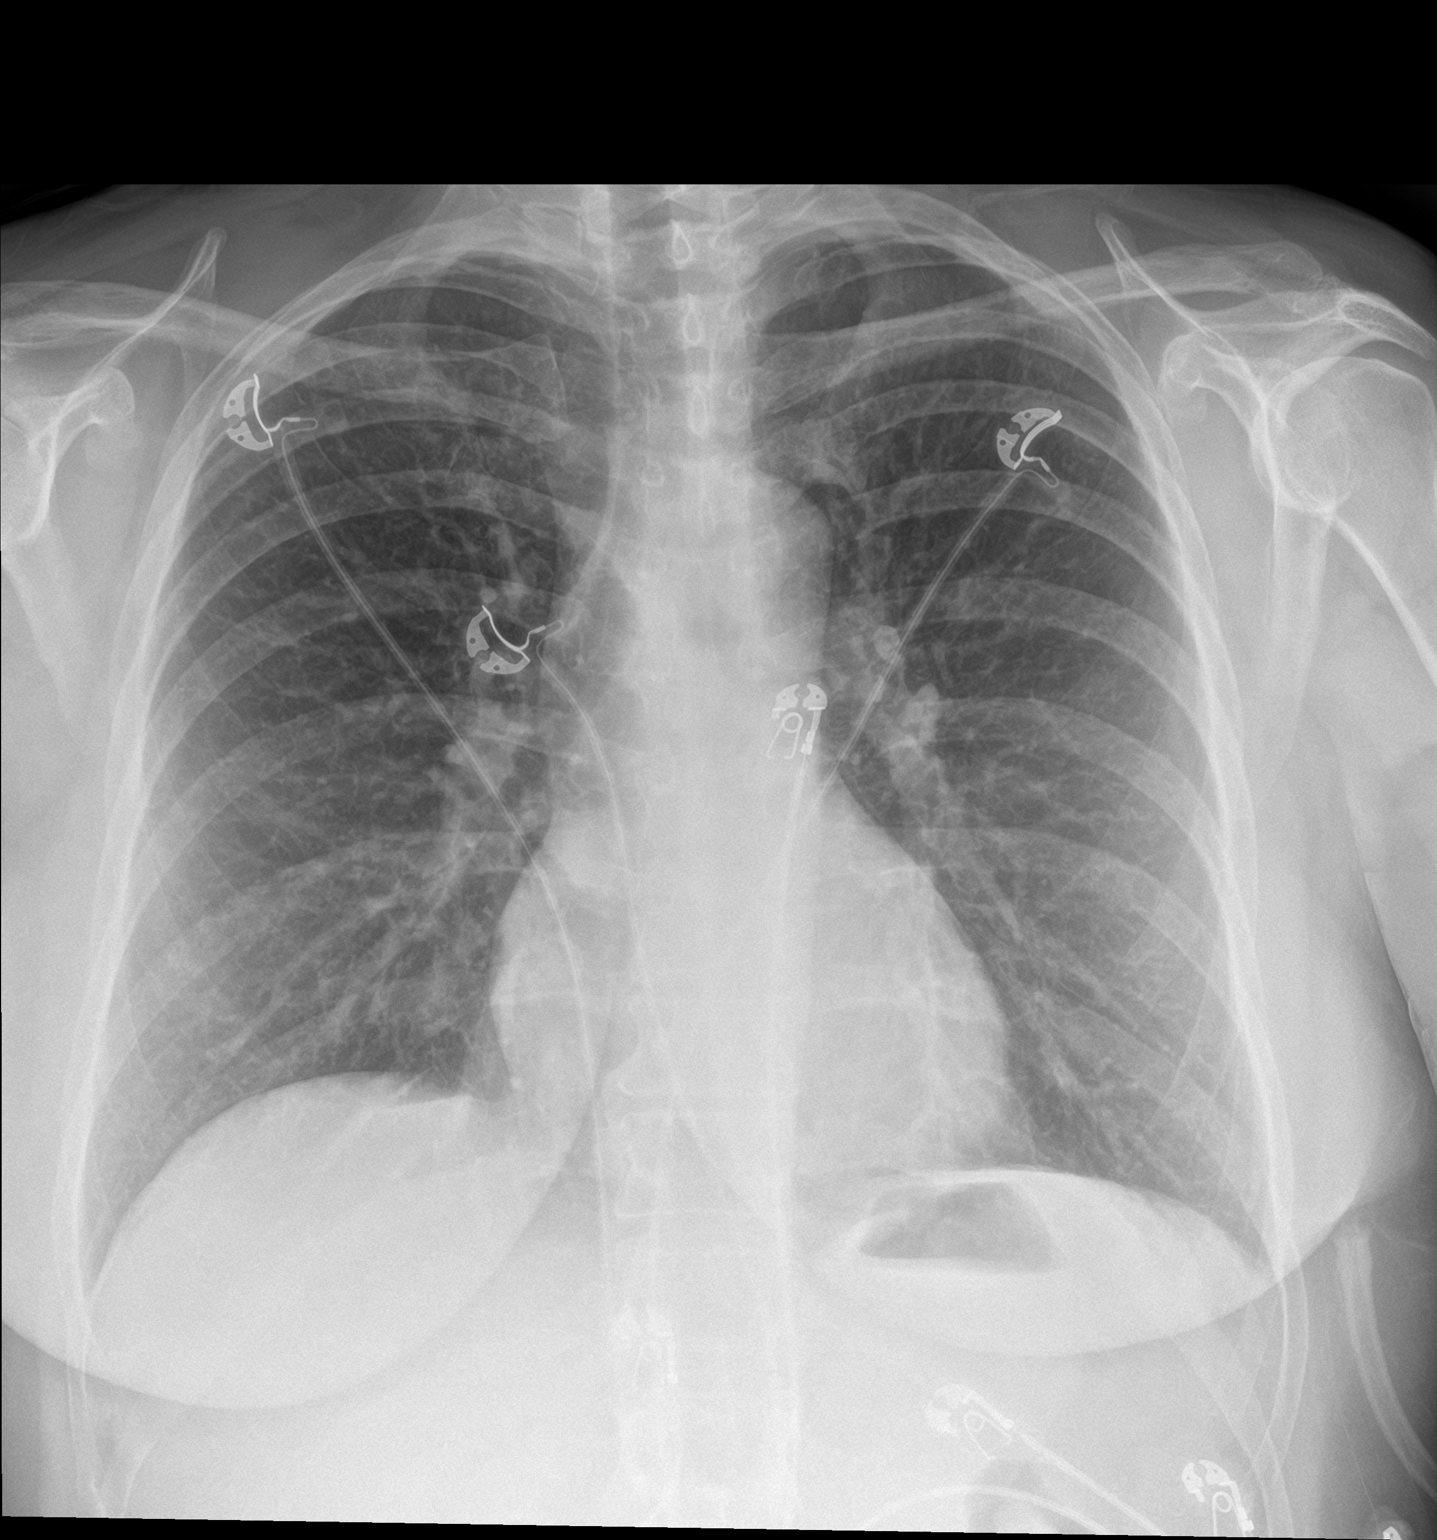

[chest lat]
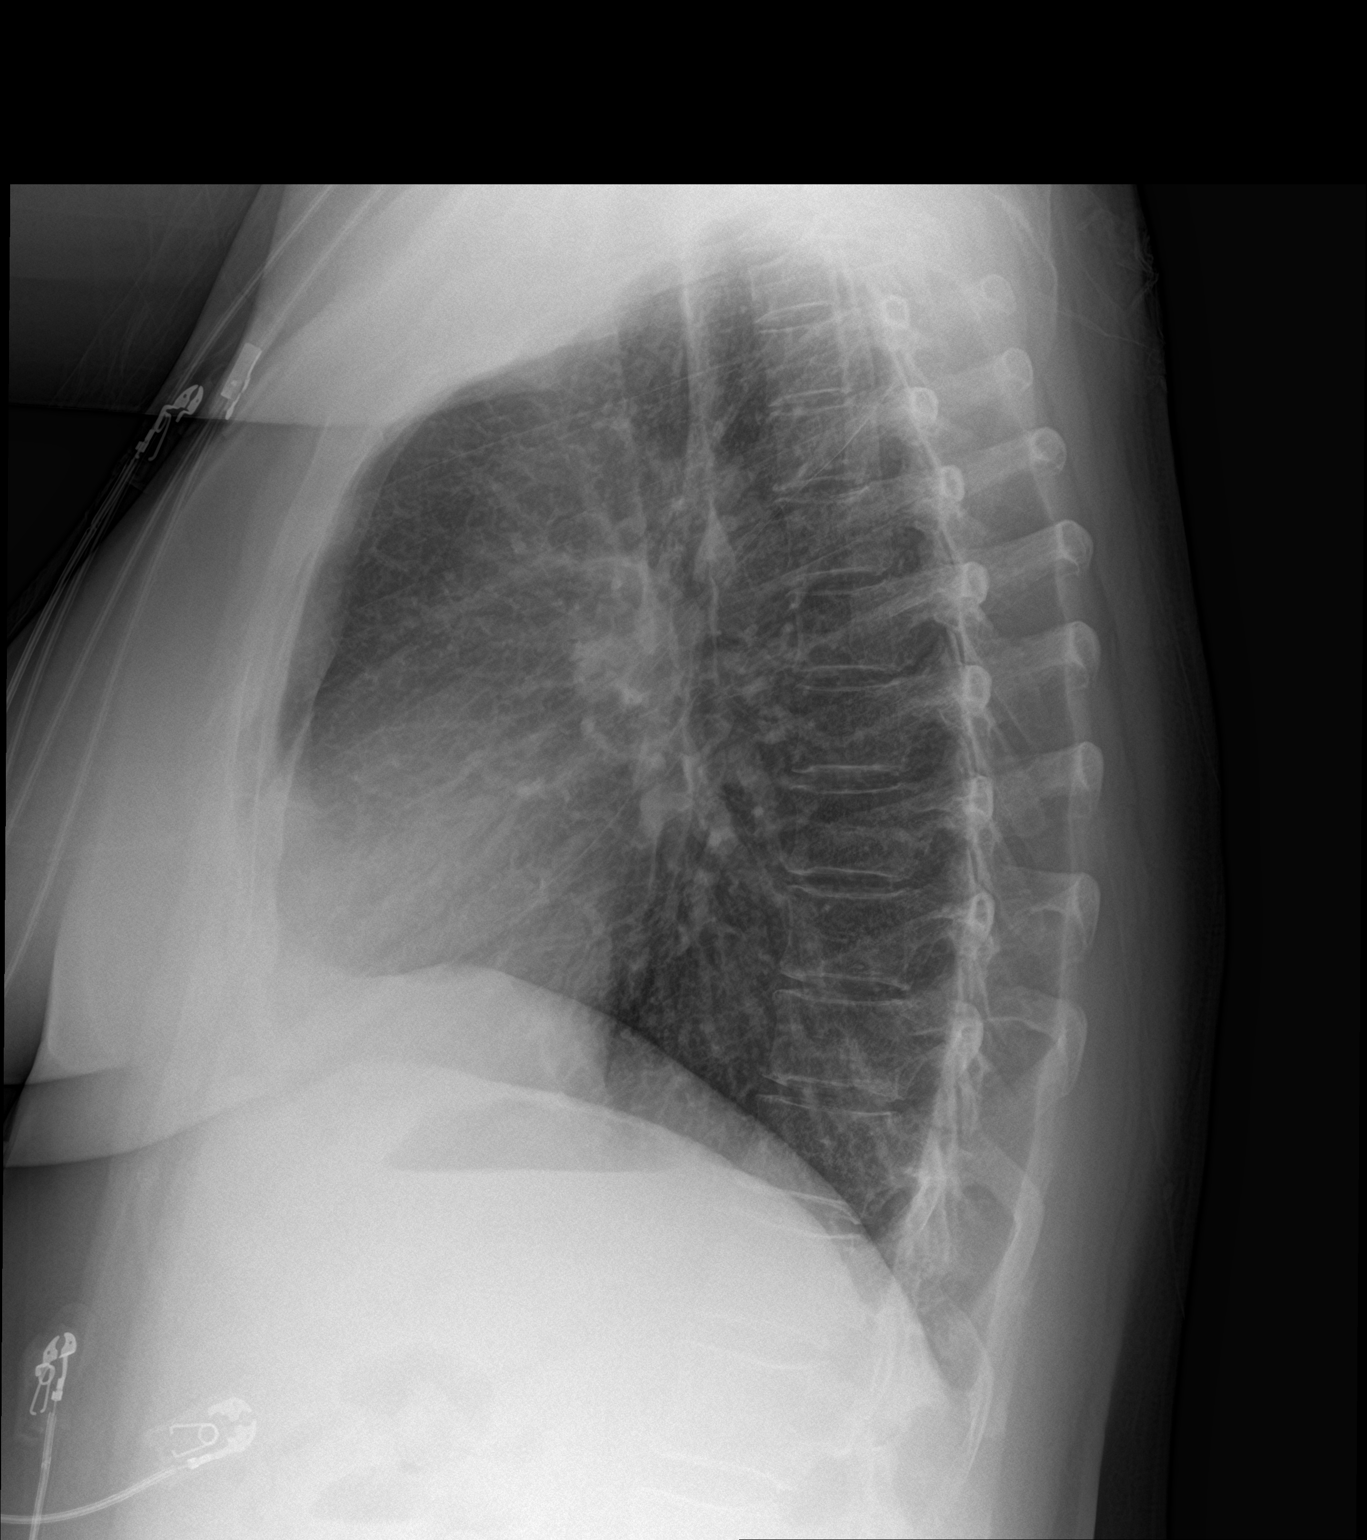

[2 of 2 positions shown; findings below may reference images not displayed]

FINDINGS: The heart size and mediastinal contours are within normal limits.
Both lungs are clear. The visualized skeletal structures are
unremarkable.
IMPRESSION: No active cardiopulmonary disease.

## 2016-12-02 NOTE — ED Provider Notes (Addendum)
[facility]-EMER[facility]ENCY DEPT Provider Note   CSN: [id number] Arrival date & time: 12/02/16  2112     History   Chief Complaint Chief Complaint  Patient presents with  . Chest Pain    HPI Sara Lindsey is a 61 y.o. female.  Central chest pain described as tightness with radiation to the left neck and jaw and left shoulder for several days, worse with exertion.  Review systems positive for dyspnea.past medical history positive for hyperlipidemia, but no diabetes or hypertension. Nonsmoker. Grandmother died from an MI at age 13. Severity of symptoms is moderate.      Past Medical History:  Diagnosis Date  . Depression   . Hyperlipidemia     There are no active problems to display for this patient.   Past Surgical History:  Procedure Laterality Date  . ABDOMINAL HYSTERECTOMY      OB History    No data available       Home Medications    Prior to Admission medications   Medication Sig Start Date End Date Taking? Authorizing Provider  aspirin 325 MG EC tablet Take 325 mg by mouth daily.   Yes [provider]  citalopram (CELEXA) 20 MG tablet Take 20 mg by mouth at bedtime.   Yes [provider]  ibuprofen (ADVIL,MOTRIN) 200 MG tablet Take 400 mg by mouth every 6 (six) hours as needed for headache or moderate pain.   Yes [provider]  simvastatin (ZOCOR) 20 MG tablet Take 20 mg by mouth every evening.   Yes [provider]    Family History No family history on file.  Social History Social History  Substance Use Topics  . Smoking status: Never Smoker  . Smokeless tobacco: Never Used  . Alcohol use No     Allergies   Azithromycin and Flexeril [cyclobenzaprine]   Review of Systems Review of Systems  All other systems reviewed and are negative.    Physical Exam Updated Vital Signs BP (!) 175/86 (BP Location: Left Arm)   Pulse 67   Temp 98.4 F (36.9 C) (Oral)   Resp 18   SpO2 97%   Physical Exam  Constitutional:  She is oriented to person, place, and time. She appears well-developed and well-nourished.  HENT:  Head: Normocephalic and atraumatic.  Eyes: Conjunctivae are normal.  Neck: Neck supple.  Cardiovascular: Normal rate and regular rhythm.   Pulmonary/Chest: Effort normal and breath sounds normal.  Abdominal: Soft. Bowel sounds are normal.  Musculoskeletal: Normal range of motion.  Neurological: She is alert and oriented to person, place, and time.  Skin: Skin is warm and dry.  Psychiatric: She has a normal mood and affect. Her behavior is normal.  Nursing note and vitals reviewed.    ED Treatments / Results  Labs (all labs ordered are listed, but only abnormal results are displayed) Labs Reviewed  BASIC METABOLIC PANEL - Abnormal; Notable for the following:       Result Value   Glucose, Bld 134 (*)    Calcium 8.6 (*)    All other components within normal limits  CBC  TROPONIN I    EKG  EKG Interpretation  Date/Time:  Saturday December 02 2016 21:25:02 EDT Ventricular Rate:  64 PR Interval:    QRS Duration: 93 QT Interval:  461 QTC Calculation: 476 R Axis:   33 Text Interpretation:  Sinus rhythm Confirmed by Donnetta Hutching ([id number]) on 12/02/2016 9:43:43 PM       Radiology Dg Chest 2 View  Result Date: 12/02/2016 CLINICAL DATA:  Intermittent chest pain starting on Wednesday and worsening today. Radiation to the left jaw and back. EXAM: CHEST  2 VIEW COMPARISON:  None. FINDINGS: The heart size and mediastinal contours are within normal limits. Both lungs are clear. The visualized skeletal structures are unremarkable. IMPRESSION: No active cardiopulmonary disease. Electronically Signed   By: Burman NievesWilliam  Stevens M.D.   On: 12/02/2016 22:30    Procedures Procedures (including critical care time)  Medications Ordered in ED Medications - No data to display   Initial Impression / Assessment and Plan / ED Course  I have reviewed the triage vital signs and the nursing  notes.  Pertinent labs & imaging results that were available during my care of the patient were reviewed by me and considered in my medical decision making (see chart for details).     Patient has a good history for acute coronary syndrome. Risk factor profile is moderate. Will consult cardiology. 2300:  [facility] He will accept transfer. Final Clinical Impressions(s) / ED Diagnoses   Final diagnoses:  Chest pain, unspecified type    New Prescriptions New Prescriptions   No medications on file     Donnetta Hutchingook, Lyndi Holbein, MD 12/02/16 [id number]    Donnetta Hutchingook, Dorotea Hand, MD 12/02/16 [phone]

## 2016-12-02 NOTE — ED Triage Notes (Signed)
Pt with intermittent chest pain that started wed. Presents today with worsening chest pain with radiation to L. jaw and back.

## 2016-12-03 ENCOUNTER — Observation Stay (HOSPITAL_COMMUNITY): Payer: BC Managed Care – PPO

## 2016-12-03 ENCOUNTER — Encounter (HOSPITAL_COMMUNITY): Payer: Self-pay | Admitting: Cardiology

## 2016-12-03 DIAGNOSIS — Z6834 Body mass index (BMI) 34.0-34.9, adult: Secondary | ICD-10-CM | POA: Diagnosis not present

## 2016-12-03 DIAGNOSIS — E669 Obesity, unspecified: Secondary | ICD-10-CM | POA: Diagnosis not present

## 2016-12-03 DIAGNOSIS — I200 Unstable angina: Secondary | ICD-10-CM | POA: Diagnosis not present

## 2016-12-03 DIAGNOSIS — E785 Hyperlipidemia, unspecified: Secondary | ICD-10-CM | POA: Diagnosis not present

## 2016-12-03 DIAGNOSIS — R079 Chest pain, unspecified: Secondary | ICD-10-CM | POA: Diagnosis present

## 2016-12-03 DIAGNOSIS — M79602 Pain in left arm: Secondary | ICD-10-CM | POA: Diagnosis not present

## 2016-12-03 DIAGNOSIS — Z8249 Family history of ischemic heart disease and other diseases of the circulatory system: Secondary | ICD-10-CM | POA: Diagnosis not present

## 2016-12-03 DIAGNOSIS — F329 Major depressive disorder, single episode, unspecified: Secondary | ICD-10-CM | POA: Diagnosis not present

## 2016-12-03 DIAGNOSIS — E7800 Pure hypercholesterolemia, unspecified: Secondary | ICD-10-CM

## 2016-12-03 DIAGNOSIS — R0789 Other chest pain: Secondary | ICD-10-CM | POA: Diagnosis not present

## 2016-12-03 DIAGNOSIS — R030 Elevated blood-pressure reading, without diagnosis of hypertension: Secondary | ICD-10-CM | POA: Diagnosis not present

## 2016-12-03 DIAGNOSIS — I2510 Atherosclerotic heart disease of native coronary artery without angina pectoris: Secondary | ICD-10-CM | POA: Diagnosis not present

## 2016-12-03 DIAGNOSIS — Z7982 Long term (current) use of aspirin: Secondary | ICD-10-CM | POA: Diagnosis not present

## 2016-12-03 LAB — COMPREHENSIVE METABOLIC PANEL
ALT: 20 U/L (ref 14–54)
AST: 26 U/L (ref 15–41)
Albumin: 3.3 g/dL — ABNORMAL LOW (ref 3.5–5.0)
Alkaline Phosphatase: 67 U/L (ref 38–126)
Anion gap: 6 (ref 5–15)
BUN: 10 mg/dL (ref 6–20)
CHLORIDE: 109 mmol/L (ref 101–111)
CO2: 25 mmol/L (ref 22–32)
CREATININE: 0.84 mg/dL (ref 0.44–1.00)
Calcium: 8.4 mg/dL — ABNORMAL LOW (ref 8.9–10.3)
Glucose, Bld: 96 mg/dL (ref 65–99)
Potassium: 3.9 mmol/L (ref 3.5–5.1)
Sodium: 140 mmol/L (ref 135–145)
Total Bilirubin: 0.7 mg/dL (ref 0.3–1.2)
Total Protein: 6.4 g/dL — ABNORMAL LOW (ref 6.5–8.1)

## 2016-12-03 LAB — CBC
HEMATOCRIT: 39.5 % (ref 36.0–46.0)
Hemoglobin: 12.8 g/dL (ref 12.0–15.0)
MCH: 29.4 pg (ref 26.0–34.0)
MCHC: 32.4 g/dL (ref 30.0–36.0)
MCV: 90.6 fL (ref 78.0–100.0)
PLATELETS: 262 10*3/uL (ref 150–400)
RBC: 4.36 MIL/uL (ref 3.87–5.11)
RDW: 14 % (ref 11.5–15.5)
WBC: 7 10*3/uL (ref 4.0–10.5)

## 2016-12-03 LAB — ECHOCARDIOGRAM COMPLETE
Height: 64 in
WEIGHTICAEL: 3220.8 [oz_av]

## 2016-12-03 LAB — TROPONIN I

## 2016-12-03 LAB — LIPID PANEL
CHOL/HDL RATIO: 8.1 ratio
CHOLESTEROL: 259 mg/dL — AB (ref 0–200)
HDL: 32 mg/dL — AB (ref >40)
LDL CALC: UNDETERMINED mg/dL (ref 0–99)
TRIGLYCERIDES: 544 mg/dL — AB (ref <150)
VLDL: UNDETERMINED mg/dL (ref 0–40)

## 2016-12-03 LAB — HIV ANTIBODY (ROUTINE TESTING W REFLEX): HIV Screen 4th Generation wRfx: NONREACTIVE

## 2016-12-03 LAB — PROTIME-INR
INR: 1.09
Prothrombin Time: 14 seconds (ref 11.4–15.2)

## 2016-12-03 LAB — HEMOGLOBIN A1C
HEMOGLOBIN A1C: 5.4 % (ref 4.8–5.6)
MEAN PLASMA GLUCOSE: 108.28 mg/dL

## 2016-12-03 LAB — TSH: TSH: 3.257 u[IU]/mL (ref 0.350–4.500)

## 2016-12-03 LAB — BRAIN NATRIURETIC PEPTIDE: B Natriuretic Peptide: 41.5 pg/mL (ref 0.0–100.0)

## 2016-12-03 LAB — GLUCOSE, CAPILLARY: Glucose-Capillary: 94 mg/dL (ref 65–99)

## 2016-12-03 LAB — HEPARIN LEVEL (UNFRACTIONATED)
Heparin Unfractionated: 0.45 IU/mL (ref 0.30–0.70)
Heparin Unfractionated: 0.28 IU/mL — ABNORMAL LOW (ref 0.30–0.70)
Heparin Unfractionated: 0.32 IU/mL (ref 0.30–0.70)

## 2016-12-03 MED ORDER — PANTOPRAZOLE SODIUM 40 MG PO TBEC
40.0000 mg | DELAYED_RELEASE_TABLET | Freq: Two times a day (BID) | ORAL | Status: DC
  Administered 2016-12-04 (×2): 40 mg via ORAL
  Filled 2016-12-03 (×3): qty 1

## 2016-12-03 MED ORDER — ASPIRIN EC 81 MG PO TBEC
81.0000 mg | DELAYED_RELEASE_TABLET | Freq: Every day | ORAL | Status: DC
Start: 2016-12-03 — End: 2016-12-04
  Administered 2016-12-03 – 2016-12-04 (×2): 81 mg via ORAL
  Filled 2016-12-03 (×2): qty 1

## 2016-12-03 MED ORDER — ATORVASTATIN CALCIUM 80 MG PO TABS
80.0000 mg | ORAL_TABLET | Freq: Every day | ORAL | Status: DC
  Administered 2016-12-03: 80 mg via ORAL
  Filled 2016-12-03: qty 1

## 2016-12-03 MED ORDER — HYDROCODONE-ACETAMINOPHEN 7.5-325 MG PO TABS
1.0000 | ORAL_TABLET | Freq: Four times a day (QID) | ORAL | Status: DC | PRN
  Administered 2016-12-03 – 2016-12-04 (×3): 1 via ORAL
  Filled 2016-12-03 (×3): qty 1

## 2016-12-03 MED ORDER — HEPARIN (PORCINE) IN NACL 100-0.45 UNIT/ML-% IJ SOLN
1050.0000 [IU]/h | INTRAMUSCULAR | Status: DC
  Administered 2016-12-03: 1050 [IU]/h via INTRAVENOUS
  Administered 2016-12-03: 900 [IU]/h via INTRAVENOUS
  Filled 2016-12-03 (×2): qty 250

## 2016-12-03 MED ORDER — CITALOPRAM HYDROBROMIDE 20 MG PO TABS
20.0000 mg | ORAL_TABLET | Freq: Every day | ORAL | Status: DC
Start: 2016-12-03 — End: 2016-12-04
  Administered 2016-12-03: 20 mg via ORAL
  Filled 2016-12-03: qty 1

## 2016-12-03 MED ORDER — NITROGLYCERIN 2 % TD OINT
0.5000 [in_us] | TOPICAL_OINTMENT | Freq: Four times a day (QID) | TRANSDERMAL | Status: DC
  Administered 2016-12-03 – 2016-12-04 (×5): 0.5 [in_us] via TOPICAL
  Filled 2016-12-03: qty 30

## 2016-12-03 MED ORDER — HEPARIN BOLUS VIA INFUSION
4000.0000 [IU] | Freq: Once | INTRAVENOUS | Status: AC
  Administered 2016-12-03: 4000 [IU] via INTRAVENOUS
  Filled 2016-12-03: qty 4000

## 2016-12-03 NOTE — Progress Notes (Signed)
  Echocardiogram 2D Echocardiogram has been performed.  Janalyn HarderWest, Dawnelle Warman R 12/03/2016, 3:39 PM

## 2016-12-03 NOTE — Progress Notes (Addendum)
ANTICOAGULATION CONSULT NOTE  Pharmacy Consult for heparin  Indication: chest pain/ACS  Allergies  Allergen Reactions  . Azithromycin Rash  . Flexeril [Cyclobenzaprine] Palpitations and Other (See Comments)    Patient reports that it also caused dizziness    Patient Measurements: Height: 5\' 4"  (162.6 cm) Weight: 201 lb 4.8 oz (91.3 kg) IBW/kg (Calculated) : 54.7 Heparin Dosing Weight: 75.3 kg   Vital Signs: Temp: 98.1 F (36.7 C) (09/09 0443) Temp Source: Oral (09/09 0443) BP: 125/74 (09/09 0443) Pulse Rate: 73 (09/09 0443)  Labs:  Recent Labs  12/02/16 2116 12/02/16 2153 12/03/16 0219 12/03/16 0705  HGB  --  12.7  --  12.8  HCT  --  38.2  --  39.5  PLT  --  265  --  262  LABPROT  --   --   --  14.0  INR  --   --   --  1.09  HEPARINUNFRC  --   --   --  0.45  CREATININE  --  0.84  --  0.84  TROPONINI <0.03  --  <0.03 <0.03    Estimated Creatinine Clearance: 76.9 mL/min (by C-G formula based on SCr of 0.84 mg/dL).  Medical History: Past Medical History:  Diagnosis Date  . Depression   . Hyperlipidemia    Assessment: 61 yo female admitted with chest pain. Pharmacy consulted to dose heparin for ACS. No oral anticoagulation PTA per medication reconciliation. CBC stable, and no s/s bleeding documented.   Heparin level therapeutic at 0.45 this AM.  Goal of Therapy:  Heparin level 0.3-0.7 units/ml Monitor platelets by anticoagulation protocol: Yes   Plan:  Continue heparin gtt at 900 units/hr Heparin level in 6 hrs to confirm Daily heparin level and CBC Monitor for s/s bleeding  Cath scheduled for 9/10  Babs BertinHaley Rue Valladares, PharmD, BCPS Clinical Pharmacist Rx Phone # for today: [phone] After 3:30PM, please call Main Rx: #[id number] 12/03/2016 10:30 AM   ADDENDUM: Confirmatory heparin level now slightly low at 0.28. No bleed or IV line issues per RN.  Plan: Increase heparin gtt to 1050 units/hr Heparin level in 6 hrs Daily heparin level and CBC Monitor for s/s  bleeding  Cath scheduled for 9/10  Babs BertinHaley Quindarius Cabello, PharmD, BCPS Clinical Pharmacist Rx Phone # for today: [phone] After 3:30PM, please call Main Rx: [phone] 12/03/2016 2:08 PM

## 2016-12-03 NOTE — Progress Notes (Signed)
ANTICOAGULATION CONSULT NOTE - Initial Consult  Pharmacy Consult for heparin  Indication: chest pain/ACS  Allergies  Allergen Reactions  . Azithromycin Rash  . Flexeril [Cyclobenzaprine] Palpitations and Other (See Comments)    Patient reports that it also caused dizziness    Patient Measurements: Height: 5\' 4"  (162.6 cm) Weight: 201 lb 4.8 oz (91.3 kg) IBW/kg (Calculated) : 54.7 Heparin Dosing Weight: 75.3 kg   Vital Signs: Temp: 98.2 F (36.8 C) (09/09 0138) Temp Source: Oral (09/09 0138) BP: 156/83 (09/09 0138) Pulse Rate: 77 (09/09 0138)  Labs:  Recent Labs  12/02/16 2116 12/02/16 2153  HGB  --  12.7  HCT  --  38.2  PLT  --  265  CREATININE  --  0.84  TROPONINI <0.03  --     Estimated Creatinine Clearance: 76.9 mL/min (by C-G formula based on SCr of 0.84 mg/dL).  Medical History: Past Medical History:  Diagnosis Date  . Depression   . Hyperlipidemia    Assessment: 61 yo female admitted with chest pain. Pharmacy consulted to dose heparin for ACS. No oral anticoagulation PTA per medication reconciliation. CBC stable and no s/s bleeding noted.   Goal of Therapy:  Heparin level 0.3-0.7 units/ml Monitor platelets by anticoagulation protocol: Yes   Plan:  Heparin bolus 4000 units x1 Start heparin gtt at 900 units/hr Heparin level in 6 hrs Daily heparin level and CBC Monitor for s/s bleeding  F/u cardiology plan   Sara Lindsey, PharmD Clinical Pharmacist 12/03/16 1:50 AM

## 2016-12-03 NOTE — Progress Notes (Signed)
ANTICOAGULATION CONSULT NOTE  Pharmacy Consult for heparin  Indication: chest pain/ACS  Allergies  Allergen Reactions  . Azithromycin Rash  . Flexeril [Cyclobenzaprine] Palpitations and Other (See Comments)    Patient reports that it also caused dizziness    Patient Measurements: Height: 5\' 4"  (162.6 cm) Weight: 201 lb 4.8 oz (91.3 kg) IBW/kg (Calculated) : 54.7 Heparin Dosing Weight: 75.3 kg   Vital Signs: Temp: 98.1 F (36.7 C) (09/09 2030) Temp Source: Oral (09/09 2030) BP: 138/77 (09/09 2030) Pulse Rate: 64 (09/09 2030)  Labs:  Recent Labs  12/02/16 2153 12/03/16 0219 12/03/16 0705 12/03/16 1332 12/03/16 1333 12/03/16 2006  HGB 12.7  --  12.8  --   --   --   HCT 38.2  --  39.5  --   --   --   PLT 265  --  262  --   --   --   LABPROT  --   --  14.0  --   --   --   INR  --   --  1.09  --   --   --   HEPARINUNFRC  --   --  0.45 0.28*  --  0.32  CREATININE 0.84  --  0.84  --   --   --   TROPONINI  --  <0.03 <0.03  --  <0.03  --     Estimated Creatinine Clearance: 76.9 mL/min (by C-G formula based on SCr of 0.84 mg/dL).  Medical History: Past Medical History:  Diagnosis Date  . Depression   . Hyperlipidemia    Assessment: 61 yo female admitted with chest pain. Pharmacy consulted to dose heparin for ACS. No oral anticoagulation PTA per medication reconciliation. CBC stable, and no s/s bleeding documented.   Heparin level now therapeutic at 0.32.  Goal of Therapy:  Heparin level 0.3-0.7 units/ml Monitor platelets by anticoagulation protocol: Yes   Plan:  Continue heparin gtt at 1050 units/hr Confirmatory heparin level with AM labs Daily heparin level and CBC Monitor for s/s bleeding  Cath scheduled for 9/10  Babs BertinHaley Enslie Sahota, PharmD, BCPS Clinical Pharmacist 12/03/2016 9:03 PM

## 2016-12-03 NOTE — H&P (Signed)
Cardiology Admission History and Physical:   Patient ID: Sara Lindsey; MRN: [id number]; DOB: October 25, 1955   Admission date: 12/02/2016  Primary Care Provider: Assunta Found, MD Primary Cardiologist: new Primary Electrophysiologist:  na  Chief Complaint:  Intermittent chest pain  Patient Profile:   Sara Lindsey is a 60 y.o. female with no prior cardiac hx, but a history of HLD, treated depressions and presented to [facility] ED with pain.  History of Present Illness:   Sara Lindsey was in her usual state of health until Wed. She was awaken from sleep with pain in Lt ant chest that radiated to Lt shoulder and into Lt post neck and Lt teeth in back.  Described as chest pressure that increased to a sharp pain with deep breath.   Eventually eased.  Since Wed. It would come and go,  worse with vacuuming.  She initially took [place] and it may have helped.   Associated with some diaphoresis and mild SOB no nausea.  With first episode rated 8/10 she felt like she would pass out.  And some Lt hand numbness. No further episodes of this. Currently aware of a tightness but mild.  She just ate BK.  + FH with GM died at 77 with MI.    She is on IV heparin.  She was transferred from [facility] ER.   EKG SR no acute abnormalities. I personally reviewed.  Will repeat this AM Troponin < 0.03 X 2, BNP 41.5 TG 544, T.chol 259, HDL 32,  CBC WNL BMP K+ 3.6 Cr 0.84 TSH 3.257 HgbA1C 5.4   CXR No active cardiopulmonary disease.   Past Medical History:  Diagnosis Date  . Depression   . Hyperlipidemia     Past Surgical History:  Procedure Laterality Date  . ABDOMINAL HYSTERECTOMY       Medications Prior to Admission: Prior to Admission medications   Medication Sig Start Date End Date Taking? Authorizing Provider  aspirin 325 MG EC tablet Take 325 mg by mouth daily.   Yes [provider]  citalopram (CELEXA) 20 MG tablet Take 20 mg by mouth at bedtime.   Yes [provider]  ibuprofen  (ADVIL,MOTRIN) 200 MG tablet Take 400 mg by mouth every 6 (six) hours as needed for headache or moderate pain.   Yes [provider]  simvastatin (ZOCOR) 20 MG tablet Take 20 mg by mouth every evening.   Yes [provider]     Allergies:    Allergies  Allergen Reactions  . Azithromycin Rash  . Flexeril [Cyclobenzaprine] Palpitations and Other (See Comments)    Patient reports that it also caused dizziness    Social History:   Social History   Social History  . Marital status: Married    Spouse name: N/A  . Number of children: N/A  . Years of education: N/A   Occupational History  . Not on file.   Social History Main Topics  . Smoking status: Never Smoker  . Smokeless tobacco: Never Used  . Alcohol use No  . Drug use: No  . Sexual activity: Yes    Birth control/ protection: Surgical   Other Topics Concern  . Not on file   Social History Narrative  . No narrative on file    Family History:   The patient's family history includes Arrhythmia in her father; Heart attack (age of onset: 20) in her maternal grandmother.    ROS:  Please see the history of present illness.  General:no colds or fevers, no weight changes Skin:no rashes or ulcers HEENT:no blurred vision, no congestion CV:see HPI PUL:see HPI GI:no diarrhea constipation or melena, no indigestion GU:no hematuria, no dysuria MS:no joint pain, no claudication Neuro:no syncope, no lightheadedness Endo:no diabetes, no thyroid disease   Physical Exam/Data:   Vitals:   12/03/16 0000 12/03/16 0049 12/03/16 0138 12/03/16 0443  BP: 137/66 133/66 (!) 156/83 125/74  Pulse: (!) 54 63 77 73  Resp: 15 17 (!) 33 20  Temp:  98.4 F (36.9 C) 98.2 F (36.8 C) 98.1 F (36.7 C)  TempSrc:   Oral Oral  SpO2: 96% 97% 100% 96%  Weight:   201 lb 4.8 oz (91.3 kg)   Height:    (1.626 m)    No intake or output data in the 24 hours ending 12/03/16 0841 Filed Weights   12/03/16 0138  Weight: 201 lb  4.8 oz (91.3 kg)   Body mass index is 34.55 kg/m.  General:  Well nourished, well developed, in no acute distress HEENT: normal Lymph: no adenopathy Neck: no JVD Endocrine:  No thryomegaly Vascular: No carotid bruits;2+ pedal pulses bil Cardiac:  normal S1, S2; RRR; no murmur gallup rub or click Lungs:  clear to auscultation bilaterally, no wheezing, rhonchi or rales  Abd: soft, nontender, no hepatomegaly  Ext: no lower ext edema Musculoskeletal:  No deformities, BUE and BLE strength normal and equal Skin: warm and dry  Neuro:  A&O X 3 MAE, follows commands, + facial symmetry   Psych:  Normal affect      Relevant CV Studies: None Echo ordered  Laboratory Data:  Chemistry  Recent Labs Lab 12/02/16 2153  NA 141  K 3.6  CL 107  CO2 27  GLUCOSE 134*  BUN 16  CREATININE 0.84  CALCIUM 8.6*  GFRNONAA >60  GFRAA >60  ANIONGAP 7    No results for input(s): PROT, ALBUMIN, AST, ALT, ALKPHOS, BILITOT in the last 168 hours. Hematology  Recent Labs Lab 12/02/16 2153 12/03/16 0705  WBC 7.9 7.0  RBC 4.22 4.36  HGB 12.7 12.8  HCT 38.2 39.5  MCV 90.5 90.6  MCH 30.1 29.4  MCHC 33.2 32.4  RDW 14.0 14.0  PLT 265 262   Cardiac Enzymes  Recent Labs Lab 12/02/16 2116 12/03/16 0219  TROPONINI <0.03 <0.03   No results for input(s): TROPIPOC in the last 168 hours.  BNP  Recent Labs Lab 12/03/16 0219  BNP 41.5    DDimer No results for input(s): DDIMER in the last 168 hours.  Radiology/Studies:  Dg Chest 2 View  Result Date: 12/02/2016 CLINICAL DATA:  Intermittent chest pain starting on Wednesday and worsening today. Radiation to the left jaw and back. EXAM: CHEST  2 VIEW COMPARISON:  None. FINDINGS: The heart size and mediastinal contours are within normal limits. Both lungs are clear. The visualized skeletal structures are unremarkable. IMPRESSION: No active cardiopulmonary disease. Electronically Signed   By: Burman Nieves M.D.   On: 12/02/2016 22:30     Assessment and Plan:   1. Chest pain, still mild, on IV heparin, admitted to eval for CAD.  troponins neg. But symptoms are suspicious for CAD.  Will add PPI and NTG paste.   Still has serial troponins to be drawn.  Exercise myoview vs cath.  MD to see.  Echo pending.    2.         Hx of hypokalemia but stable here.   3.  HLD with elevated TG, on lipitor 80 at home had been on Zocor 20 mg.   Severity of Illness: The appropriate patient status for this patient is OBSERVATION. Observation status is judged to be reasonable and necessary in order to provide the required intensity of service to ensure the patient's safety. The patient's presenting symptoms, physical exam findings, and initial radiographic and laboratory data in the context of their medical condition is felt to place them at decreased risk for further clinical deterioration. Furthermore, it is anticipated that the patient will be medically stable for discharge from the hospital within 2 midnights of admission. The following factors support the patient status of observation.   " The patient's presenting symptoms include chest pain worrisome for CAD/unstable angina. " The physical exam findings include stable. " The initial radiographic and laboratory data are normal     For questions or updates, please contact [facility] Please consult [email or web address] for contact info under Cardiology/STEMI. Daytime calls, contact the Day Call APP (6a-8a) or assigned team (Teams A-D) provider (7:30a - 5p). All other daytime calls (7:30-5p), contact the Card Master @ [phone].   Nighttime calls, contact the assigned APP (5p-8p) or MD (6:30p-8p). Overnight calls (8p-6a), contact the on call Fellow @ [phone].    Signed, Nada BoozerLaura Ingold, NP  12/03/2016 8:41 AM   Personally seen and examined. Agree with above.  61 year old with symptoms concerning for unstable angina. Exertional symptoms. Spoke to them about getting a definitive  answer with heart cath.  Currently troponin is normal. ECG with no ST changes personally viewed.   Exam: RRR, CTAB, soft abd, no edema, no JVD, alert. 2+ radial   Unstable angina  - cath Monday  - risks and benefits discussed  - IV heparin, Bb, ASA, statin  Donato SchultzMark Skains, MD

## 2016-12-04 ENCOUNTER — Encounter (HOSPITAL_COMMUNITY)
Admission: EM | Disposition: A | Discharge status: Home / Self Care | Payer: Self-pay | Source: Home / Self Care | Attending: Emergency Medicine

## 2016-12-04 ENCOUNTER — Encounter (HOSPITAL_COMMUNITY): Payer: Self-pay | Admitting: Physician Assistant

## 2016-12-04 DIAGNOSIS — R030 Elevated blood-pressure reading, without diagnosis of hypertension: Secondary | ICD-10-CM

## 2016-12-04 DIAGNOSIS — E6609 Other obesity due to excess calories: Secondary | ICD-10-CM

## 2016-12-04 DIAGNOSIS — I2510 Atherosclerotic heart disease of native coronary artery without angina pectoris: Secondary | ICD-10-CM | POA: Diagnosis not present

## 2016-12-04 DIAGNOSIS — E669 Obesity, unspecified: Secondary | ICD-10-CM

## 2016-12-04 DIAGNOSIS — R079 Chest pain, unspecified: Secondary | ICD-10-CM | POA: Insufficient documentation

## 2016-12-04 DIAGNOSIS — R0789 Other chest pain: Secondary | ICD-10-CM | POA: Diagnosis not present

## 2016-12-04 HISTORY — PX: LEFT HEART CATH AND CORONARY ANGIOGRAPHY: CATH118249

## 2016-12-04 LAB — CBC
HEMATOCRIT: 42.1 % (ref 36.0–46.0)
HEMOGLOBIN: 13.6 g/dL (ref 12.0–15.0)
MCH: 29.4 pg (ref 26.0–34.0)
MCHC: 32.3 g/dL (ref 30.0–36.0)
MCV: 91.1 fL (ref 78.0–100.0)
Platelets: 288 10*3/uL (ref 150–400)
RBC: 4.62 MIL/uL (ref 3.87–5.11)
RDW: 14.3 % (ref 11.5–15.5)
WBC: 8.6 10*3/uL (ref 4.0–10.5)

## 2016-12-04 LAB — HEPARIN LEVEL (UNFRACTIONATED): HEPARIN UNFRACTIONATED: 0.46 [IU]/mL (ref 0.30–0.70)

## 2016-12-04 LAB — GLUCOSE, CAPILLARY: Glucose-Capillary: 98 mg/dL (ref 65–99)

## 2016-12-04 SURGERY — LEFT HEART CATH AND CORONARY ANGIOGRAPHY
Anesthesia: LOCAL

## 2016-12-04 MED ORDER — SODIUM CHLORIDE 0.9 % WEIGHT BASED INFUSION
1.0000 mL/kg/h | INTRAVENOUS | Status: DC
  Administered 2016-12-04: 1 mL/kg/h via INTRAVENOUS

## 2016-12-04 MED ORDER — LIDOCAINE HCL (PF) 1 % IJ SOLN
INTRAMUSCULAR | Status: DC | PRN
  Administered 2016-12-04: 2 mL via SUBCUTANEOUS

## 2016-12-04 MED ORDER — FENTANYL CITRATE (PF) 100 MCG/2ML IJ SOLN
INTRAMUSCULAR | Status: DC | PRN
  Administered 2016-12-04: 25 ug via INTRAVENOUS

## 2016-12-04 MED ORDER — FENTANYL CITRATE (PF) 100 MCG/2ML IJ SOLN
INTRAMUSCULAR | Status: AC
  Filled 2016-12-04: qty 2

## 2016-12-04 MED ORDER — MIDAZOLAM HCL 2 MG/2ML IJ SOLN
INTRAMUSCULAR | Status: DC | PRN
  Administered 2016-12-04: 2 mg via INTRAVENOUS

## 2016-12-04 MED ORDER — HEPARIN (PORCINE) IN NACL 2-0.9 UNIT/ML-% IJ SOLN
INTRAMUSCULAR | Status: AC | PRN
  Administered 2016-12-04: 1000 mL

## 2016-12-04 MED ORDER — SODIUM CHLORIDE 0.9 % IV SOLN: 250.0000 mL | INTRAVENOUS | Status: DC | PRN

## 2016-12-04 MED ORDER — SODIUM CHLORIDE 0.9% FLUSH: 3.0000 mL | Freq: Two times a day (BID) | INTRAVENOUS | Status: DC

## 2016-12-04 MED ORDER — ACETAMINOPHEN 325 MG PO TABS: 650.0000 mg | ORAL_TABLET | ORAL | Status: DC | PRN

## 2016-12-04 MED ORDER — SODIUM CHLORIDE 0.9 % WEIGHT BASED INFUSION
3.0000 mL/kg/h | INTRAVENOUS | Status: AC
  Administered 2016-12-04: 3 mL/kg/h via INTRAVENOUS

## 2016-12-04 MED ORDER — SODIUM CHLORIDE 0.9% FLUSH: 3.0000 mL | INTRAVENOUS | Status: DC | PRN

## 2016-12-04 MED ORDER — HEPARIN SODIUM (PORCINE) 1000 UNIT/ML IJ SOLN
INTRAMUSCULAR | Status: AC
  Filled 2016-12-04: qty 1

## 2016-12-04 MED ORDER — ATORVASTATIN CALCIUM 40 MG PO TABS: 40.0000 mg | ORAL_TABLET | Freq: Every day | ORAL | Status: DC

## 2016-12-04 MED ORDER — IOPAMIDOL (ISOVUE-370) INJECTION 76%
INTRAVENOUS | Status: AC
  Filled 2016-12-04: qty 100

## 2016-12-04 MED ORDER — ATORVASTATIN CALCIUM 40 MG PO TABS: 40.0000 mg | ORAL_TABLET | Freq: Every evening | ORAL | 2 refills | Status: DC

## 2016-12-04 MED ORDER — ASPIRIN 81 MG PO TBEC: 81.0000 mg | DELAYED_RELEASE_TABLET | Freq: Every day | ORAL | Status: AC

## 2016-12-04 MED ORDER — HEPARIN SODIUM (PORCINE) 1000 UNIT/ML IJ SOLN
INTRAMUSCULAR | Status: DC | PRN
  Administered 2016-12-04: 5000 [IU] via INTRAVENOUS

## 2016-12-04 MED ORDER — MIDAZOLAM HCL 2 MG/2ML IJ SOLN
INTRAMUSCULAR | Status: AC
  Filled 2016-12-04: qty 2

## 2016-12-04 MED ORDER — IOPAMIDOL (ISOVUE-370) INJECTION 76%
INTRAVENOUS | Status: DC | PRN
  Administered 2016-12-04: 25 mL via INTRA_ARTERIAL

## 2016-12-04 MED ORDER — OMEPRAZOLE 20 MG PO CPDR: 20.0000 mg | DELAYED_RELEASE_CAPSULE | Freq: Every day | ORAL | 0 refills | Status: AC

## 2016-12-04 MED ORDER — HEPARIN (PORCINE) IN NACL 2-0.9 UNIT/ML-% IJ SOLN
INTRAMUSCULAR | Status: AC
  Filled 2016-12-04: qty 1000

## 2016-12-04 MED ORDER — ONDANSETRON HCL 4 MG/2ML IJ SOLN
4.0000 mg | Freq: Four times a day (QID) | INTRAMUSCULAR | Status: DC | PRN
Start: 2016-12-04 — End: 2016-12-04

## 2016-12-04 MED ORDER — VERAPAMIL HCL 2.5 MG/ML IV SOLN
INTRAVENOUS | Status: AC
  Filled 2016-12-04: qty 2

## 2016-12-04 MED ORDER — VERAPAMIL HCL 2.5 MG/ML IV SOLN
INTRAVENOUS | Status: DC | PRN
  Administered 2016-12-04: 10 mL via INTRA_ARTERIAL

## 2016-12-04 MED ORDER — LIDOCAINE HCL 2 % IJ SOLN
INTRAMUSCULAR | Status: AC
  Filled 2016-12-04: qty 10

## 2016-12-04 SURGICAL SUPPLY — 9 items
CATH 5FR JL3.5 JR4 ANG PIG MP (CATHETERS) ×2 IMPLANT
DEVICE RAD COMP TR BAND LRG (VASCULAR PRODUCTS) ×2 IMPLANT
GLIDESHEATH SLEND SS 6F .021 (SHEATH) ×2 IMPLANT
GUIDEWIRE INQWIRE 1.5J.035X260 (WIRE) ×1 IMPLANT
INQWIRE 1.5J .035X260CM (WIRE) ×2
KIT HEART LEFT (KITS) ×2 IMPLANT
PACK CARDIAC CATHETERIZATION (CUSTOM PROCEDURE TRAY) ×2 IMPLANT
TRANSDUCER W/STOPCOCK (MISCELLANEOUS) ×2 IMPLANT
TUBING CIL FLEX 10 FLL-RA (TUBING) ×2 IMPLANT

## 2016-12-04 NOTE — Progress Notes (Signed)
Patient arrived from cath lab with TR band in place on right wrist with 14 cc of air.  Will continue to monitor.

## 2016-12-04 NOTE — Progress Notes (Signed)
TR band removed without difficulty.  No bleeding noted.

## 2016-12-04 NOTE — Interval H&P Note (Signed)
Cath Lab Visit (complete for each Cath Lab visit)  Clinical Evaluation Leading to the Procedure:   ACS: Yes.    Non-ACS:    Anginal Classification: CCS IV  Anti-ischemic medical therapy: No Therapy  Non-Invasive Test Results: No non-invasive testing performed  Prior CABG: No previous CABG      History and Physical Interval Note:  12/04/2016 2:16 PM  Sara Lindsey  has presented today for surgery, with the diagnosis of unstable angina  The various methods of treatment have been discussed with the patient and family. After consideration of risks, benefits and other options for treatment, the patient has consented to  Procedure(s): LEFT HEART CATH AND CORONARY ANGIOGRAPHY (N/A) as a surgical intervention .  The patient's history has been reviewed, patient examined, no change in status, stable for surgery.  I have reviewed the patient's chart and labs.  Questions were answered to the patient's satisfaction.     Tonny Bollmanooper, Ulas Zuercher

## 2016-12-04 NOTE — Progress Notes (Signed)
Plan dc once TR band is off if no complications - d/w nurse - plan is to ambulate pt and dc if hemodynamically stable. Reviewed f/u plan with pt. Ikhlas Albo PA-C

## 2016-12-04 NOTE — Progress Notes (Signed)
Pt. Walked down hallway. No complaints and with ease.

## 2016-12-04 NOTE — Progress Notes (Signed)
Order received to discharge patient.  Telemetry monitor removed and CCMD notified.  PIV access removed.  Discharge instructions, follow up, medications and instructions for their use discussed with patient. 

## 2016-12-04 NOTE — Progress Notes (Signed)
Progress Note  Patient Name: Sara Lindsey Date of Encounter: 12/04/2016  Primary Cardiologist: New, lives in [place]  Subjective   Denies any CP this AM. Feeling well. Nervous. No SOB. No edema. No recent travel/surgery/bedrest.  Inpatient Medications    Scheduled Meds: . aspirin EC  81 mg Oral Daily  . atorvastatin  80 mg Oral q1800  . citalopram  20 mg Oral QHS  . nitroGLYCERIN  0.5 inch Topical Q6H  . pantoprazole  40 mg Oral BID AC  . sodium chloride flush  3 mL Intravenous Q12H   Continuous Infusions: . sodium chloride    . sodium chloride     Followed by  . sodium chloride    . heparin 1,050 Units/hr (12/03/16 2103)   PRN Meds: sodium chloride, HYDROcodone-acetaminophen, sodium chloride flush   Vital Signs    Vitals:   12/03/16 0138 12/03/16 0443 12/03/16 2030 12/04/16 0437  BP: (!) 156/83 125/74 138/77 (!) 150/74  Pulse: 77 73 64 76  Resp: (!) 33 20 (!) 22 15  Temp: 98.2 F (36.8 C) 98.1 F (36.7 C) 98.1 F (36.7 C) 97.8 F (36.6 C)  TempSrc: Oral Oral Oral Oral  SpO2: 100% 96% 98% 97%  Weight: 201 lb 4.8 oz (91.3 kg)   198 lb 8 oz (90 kg)  Height: 5\' 4"  (1.626 m)       Intake/Output Summary (Last 24 hours) at 12/04/16 1306 Last data filed at 12/03/16 1700  Gross per 24 hour  Intake              360 ml  Output                0 ml  Net              360 ml   Filed Weights   12/03/16 0138 12/04/16 0437  Weight: 201 lb 4.8 oz (91.3 kg) 198 lb 8 oz (90 kg)    Telemetry    NSR- Personally Reviewed  Physical Exam   GEN: No acute distress.  HEENT: Normocephalic, atraumatic, sclera non-icteric. Neck: No JVD or bruits. Cardiac: RRR no murmurs, rubs, or gallops.  Radials/DP/PT 1+ and equal bilaterally.  Respiratory: Clear to auscultation bilaterally. Breathing is unlabored. GI: Soft, nontender, non-distended, BS +x 4. MS: no deformity. Extremities: No clubbing or cyanosis. No edema. Distal pedal pulses are 2+ and equal  bilaterally. Neuro:  AAOx3. Follows commands. Psych:  Responds to questions appropriately with a normal affect.  Labs    Chemistry Recent Labs Lab 12/02/16 2153 12/03/16 0705  NA 141 140  K 3.6 3.9  CL 107 109  CO2 27 25  GLUCOSE 134* 96  BUN 16 10  CREATININE 0.84 0.84  CALCIUM 8.6* 8.4*  PROT  --  6.4*  ALBUMIN  --  3.3*  AST  --  26  ALT  --  20  ALKPHOS  --  67  BILITOT  --  0.7  GFRNONAA >60 >60  GFRAA >60 >60  ANIONGAP 7 6     Hematology Recent Labs Lab 12/02/16 2153 12/03/16 0705 12/04/16 0153  WBC 7.9 7.0 8.6  RBC 4.22 4.36 4.62  HGB 12.7 12.8 13.6  HCT 38.2 39.5 42.1  MCV 90.5 90.6 91.1  MCH 30.1 29.4 29.4  MCHC 33.2 32.4 32.3  RDW 14.0 14.0 14.3  PLT 265 262 288    Cardiac Enzymes Recent Labs Lab 12/02/16 2116 12/03/16 0219 12/03/16 0705 12/03/16 1333  TROPONINI <0.03 <0.03 <  0.03 <0.03   No results for input(s): TROPIPOC in the last 168 hours.   BNP Recent Labs Lab 12/03/16 0219  BNP 41.5     DDimer No results for input(s): DDIMER in the last 168 hours.   Radiology    Dg Chest 2 View  Result Date: 12/02/2016 CLINICAL DATA:  Intermittent chest pain starting on Wednesday and worsening today. Radiation to the left jaw and back. EXAM: CHEST  2 VIEW COMPARISON:  None. FINDINGS: The heart size and mediastinal contours are within normal limits. Both lungs are clear. The visualized skeletal structures are unremarkable. IMPRESSION: No active cardiopulmonary disease. Electronically Signed   By: Burman Nieves M.D.   On: 12/02/2016 22:30    Cardiac Studies   pending  Patient Profile     61 y.o. female with HLD, family history of CAD, depression, intermittently elevated BP here in the hospital who presented to [facility] with symptoms of chest discomfort.  Assessment & Plan    1. Chest pain, possibly suggestive of unstable angina - mixed atypical/typical features. Troponins negative, BNP wnl, EKG unremarkable. Dr Anne Fu recommended cath.  Reviewed procedure again today with patient. Risks and benefits of cardiac catheterization have been discussed with the patient.  These include bleeding, infection, kidney damage, stroke, heart attack, death.  The patient understands these risks and is willing to proceed. If cath shows normal cors, would consider coronary spasm versus GERD in differential. Continue aspirin. Remains on heparin and NTG paste. No recent risk factors for PE and no evidence of DVT on exam.  2. Hyperlipidemia- triglycerides quite high. Initial glucose mildly elevated but not to the degree I would expect this trig level. Simvastatin was changed to atorvastatin while coronary status is evaluated. Based on presence of CAD may need to consider Lovaza or fenofibrate.  3. Elevated blood pressure without diagnosis of HTN- BP mildly elevated at times but patient is anxious. Consider initiation of low dose agent contingent on cath results and suspected dx.  Signed, Laurann Montana, PA-C  12/04/2016, 1:06 PM    Patient seen, examined. Available data reviewed. Agree with findings, assessment, and plan as outlined by Ronie Spies, PA-C. Pt evaluated on arrival to cath lab. Agree with data and plan as outlined above. Further disposition pending cath result.   Tonny Bollman, M.D. 12/04/2016 4:51 PM

## 2016-12-04 NOTE — Progress Notes (Signed)
Patient ambulated in the hall without difficulty or report of chest pain before discharge.

## 2016-12-04 NOTE — Discharge Summary (Signed)
Discharge Summary    Patient ID: Sara Lindsey,  MRN: [id number], DOB/AGE: 61/03/1955 61 y.o.  Admit date: 12/02/2016 Discharge date: 12/04/2016  Primary Care Provider: Assunta FoundGolding, John Primary Cardiologist: New, lives in [place] Seen by Dr. Anne FuSkains this admission.  Discharge Diagnoses    Principal Problem:   Chest pain at rest Active Problems:   Hyperlipidemia   Obesity   Elevated blood pressure reading without diagnosis of hypertension   Mild CAD   Diagnostic Studies/Procedures    Procedures  LEFT HEART CATH AND CORONARY ANGIOGRAPHY  Conclusion  1. Minor nonobstructive CAD with very mild stenosis at the LAD/diagonal bifurcation, otherwise normal coronary arteries 2. Normal LV function by noninvasive assessment 3. Normal LVEDP  Suspect noncardiac chest pain. Recommend risk reduction measures.   ____________     History of Present Illness     Sara Lindsey is a 61 y.o. female with history of hyperlipidemia, depression, hypokalemia, obesity who [facility] upon transfer from the [facility] ED with chest pain. She was in her usual state of health until Wednesday 11/29/16 when she awoke with left anterior chest pain radiating to her left shoulder into her left posterior neck and left teeth in her back. The pain eventually eased off but continued to come and go. On some occasions it was worse with vacuuming but at other times she was able to exert herself without any difficulty. She did notice some improvement in Tums. She had a recurrent episode with strange-feeling pain in her left arm and teeth causing her to seek care in the ED. Her grandmother died at age 61 of MI. She was placed on IV heparin and was [facility] due to concern for unstable angina.  Hospital Course    EKG was without acute abnormality. Labs showed normal troponins, BNP wnl, normal CBC, glucose 134. Lipid panel did reveal dyslipidemia with TChol 259, trig 544, HDL  32, unable to calculate LDL. TSH wnl. HIV nonreactive. CXR NAD. Dr. Anne FuSkains discussed options for evaluation with the patient and felt that definitive evaluation was warranted. She underwent cardiac catheterization today showing minor nonobstructive CAD with very mild stenosis at the LAD/diagonal bifurcation, otherwise normal coronary arteries. Normal LVEDP. She tolerated this procedure well. Due to her dyslipidemia, per d/w Dr. Delton SeeNelson, we changed statin from simvastatin to atorvastatin 40mg  qpm. If the patient is tolerating statin at time of follow-up appointment, would consider rechecking liver function/lipid panel in 6-8 weeks - she thinks this will correlate with timing of her f/u with primary care. Aspirin was reduced to 81mg  daily and omeprazole was added. She was advised to hold off further ibuprofen in case it was aggravating GERD, causing her symptoms. She was also advised to f/u PCP for noncardiac causes of chest pain. Her blood pressure was mildly elevated up to 150/74 but also down to 113/74 this admission as well. She was advised to monitor BP at home and call if running >130/80. Dr. Excell Seltzerooper has seen and examined the patient today and feels she is stable for discharge.  _____________  Discharge Vitals Blood pressure 114/70, pulse (!) 0, temperature 97.8 F (36.6 C), temperature source Oral, resp. rate (!) 0, height 5\' 4"  (1.626 m), weight 201 lb 8 oz (91.4 kg), SpO2 (!) 0 %.  Filed Weights   12/03/16 0138 12/04/16 0437 12/04/16 1259  Weight: 201 lb 4.8 oz (91.3 kg) 198 lb 8 oz (90 kg) 201 lb 8 oz (91.4 kg)  Labs & Radiologic Studies    CBC  Recent Labs  12/03/16 0705 12/04/16 0153  WBC 7.0 8.6  HGB 12.8 13.6  HCT 39.5 42.1  MCV 90.6 91.1  PLT 262 288   Basic Metabolic Panel  Recent Labs  12/02/16 2153 12/03/16 0705  NA 141 140  K 3.6 3.9  CL 107 109  CO2 27 25  GLUCOSE 134* 96  BUN 16 10  CREATININE 0.84 0.84  CALCIUM 8.6* 8.4*   Liver Function Tests  Recent  Labs  12/03/16 0705  AST 26  ALT 20  ALKPHOS 67  BILITOT 0.7  PROT 6.4*  ALBUMIN 3.3*   Cardiac Enzymes  Recent Labs  12/03/16 0219 12/03/16 0705 12/03/16 1333  TROPONINI <0.03 <0.03 <0.03   BNP Invalid input(s): POCBNP D-Dimer No results for input(s): DDIMER in the last 72 hours. Hemoglobin A1C  Recent Labs  12/03/16 0219  HGBA1C 5.4   Fasting Lipid Panel  Recent Labs  12/03/16 0219  CHOL 259*  HDL 32*  LDLCALC UNABLE TO CALCULATE IF TRIGLYCERIDE OVER 400 mg/dL  TRIG 098*  CHOLHDL 8.1   Thyroid Function Tests  Recent Labs  12/03/16 0219  TSH 3.257   _____________  Dg Chest 2 View  Result Date: 12/02/2016 CLINICAL DATA:  Intermittent chest pain starting on Wednesday and worsening today. Radiation to the left jaw and back. EXAM: CHEST  2 VIEW COMPARISON:  None. FINDINGS: The heart size and mediastinal contours are within normal limits. Both lungs are clear. The visualized skeletal structures are unremarkable. IMPRESSION: No active cardiopulmonary disease. Electronically Signed   By: Burman Nieves M.D.   On: 12/02/2016 22:30   Disposition   Pt is being discharged home today in good condition.  Follow-up Plans & Appointments    Follow-up Information    Jodelle Gross, NP Follow up.   Specialties:  Nurse Practitioner, Radiology, Cardiology Why:  [facility] - 12/25/16 at 2pm for post-cath site check. This location is located inside [facility]. Please arrive 15 minutes early to check in. Contact information: [address] [place] [address] [phone]          Discharge Instructions    Diet - low sodium heart healthy    Complete by:  As directed    Increase activity slowly    Complete by:  As directed    Your aspirin was decreased to  daily. We stopped your ibuprofen in case it has been causing symptoms of acid reflux. A trial of omeprazole was added in case this represents acid reflux. We would recommend you follow up  with your primary care provider to discuss non-cardiac causes of chest pain if it recurs.  Your cholesterol panel showed elevated triglycerides, low HDL (good cholesterol) and elevated overall total cholesterol. Your simvastatin was changed to atorvastatin for this reason. You will need follow-up labwork in about 6-8 weeks. Please discuss with your cardiology provider at follow-up. You can also have this done through your primary care. Please monitor your blood pressure occasionally at home. Call your doctor if you tend to get readings of greater than 130 on the top number or 80 on the bottom number.      Discharge Medications   Allergies as of 12/04/2016      Reactions   Azithromycin Rash   Flexeril [cyclobenzaprine] Palpitations, Other (See Comments)   Patient reports that it also caused dizziness      Medication List    STOP taking these medications  ibuprofen 200 MG tablet Commonly known as:  ADVIL,MOTRIN   simvastatin 20 MG tablet Commonly known as:  ZOCOR     TAKE these medications   aspirin 81 MG EC tablet Take 1 tablet (81 mg total) by mouth daily. What changed:  medication strength  how much to take   atorvastatin 40 MG tablet Commonly known as:  LIPITOR Take 1 tablet (40 mg total) by mouth every evening.   citalopram 20 MG tablet Commonly known as:  CELEXA Take 20 mg by mouth at bedtime.   omeprazole 20 MG capsule Commonly known as:  PRILOSEC Take 1 capsule (20 mg total) by mouth daily.            Discharge Care Instructions        Start     Ordered   12/04/16 0000  aspirin 81 MG EC tablet  Daily    Question:  Supervising Provider  Answer:  Dolores Patty   12/04/16 1720   12/04/16 0000  atorvastatin (LIPITOR) 40 MG tablet  Every evening    Question:  Supervising Provider  Answer:  Arvilla Meres R   12/04/16 1720   12/04/16 0000  omeprazole (PRILOSEC) 20 MG capsule  Daily    Question:  Supervising Provider  Answer:  Dolores Patty    12/04/16 1720   12/04/16 0000  Diet - low sodium heart healthy     12/04/16 1720   12/04/16 0000  Increase activity slowly     12/04/16 1720       Allergies:  Allergies  Allergen Reactions  . Azithromycin Rash  . Flexeril [Cyclobenzaprine] Palpitations and Other (See Comments)    Patient reports that it also caused dizziness    Outstanding Labs/Studies   N/A  Duration of Discharge Encounter   Greater than 30 minutes including physician time.  Signed, Laurann Montana PA-C 12/04/2016, 5:22 PM

## 2016-12-04 NOTE — Progress Notes (Deleted)
JP drain #2 removed. Pt. Tolerated well.

## 2016-12-04 NOTE — Progress Notes (Signed)
ANTICOAGULATION CONSULT NOTE  Pharmacy Consult for heparin  Indication: chest pain/ACS  Allergies  Allergen Reactions  . Azithromycin Rash  . Flexeril [Cyclobenzaprine] Palpitations and Other (See Comments)    Patient reports that it also caused dizziness    Patient Measurements: Height: 5\' 4"  (162.6 cm) Weight: 198 lb 8 oz (90 kg) IBW/kg (Calculated) : 54.7 Heparin Dosing Weight: 75.3 kg   Vital Signs: Temp: 97.8 F (36.6 C) (09/10 0437) Temp Source: Oral (09/10 0437) BP: 150/74 (09/10 0437) Pulse Rate: 76 (09/10 0437)  Labs:  Recent Labs  12/02/16 2153 12/03/16 0219  12/03/16 0705 12/03/16 1332 12/03/16 1333 12/03/16 2006 12/04/16 0153  HGB 12.7  --   --  12.8  --   --   --  13.6  HCT 38.2  --   --  39.5  --   --   --  42.1  PLT 265  --   --  262  --   --   --  288  LABPROT  --   --   --  14.0  --   --   --   --   INR  --   --   --  1.09  --   --   --   --   HEPARINUNFRC  --   --   < > 0.45 0.28*  --  0.32 0.46  CREATININE 0.84  --   --  0.84  --   --   --   --   TROPONINI  --  <0.03  --  <0.03  --  <0.03  --   --   < > = values in this interval not displayed.  Estimated Creatinine Clearance: 76.4 mL/min (by C-G formula based on SCr of 0.84 mg/dL).  Medical History: Past Medical History:  Diagnosis Date  . Depression   . Hyperlipidemia    Assessment: 61 yo female admitted with chest pain. Pharmacy consulted to dose heparin for ACS. No oral anticoagulation PTA per medication reconciliation. H/H is stable, plts are within normal limits, and no s/s bleeding documented.   Heparin level continues to remain within therapeutic range at 0.46 today at a rate of 1050 units/hr.   Goal of Therapy:  Heparin level 0.3-0.7 units/ml Monitor platelets by anticoagulation protocol: Yes   Plan:  Continue heparin gtt at 1050 units/hr Daily heparin level and CBC Monitor for s/s bleeding  Follow-up with heparin infusion plans following cath scheduled for 9/10  Girard CooterKimberly  Perkins, PharmD Clinical Pharmacist  Phone: [phone] 12/04/2016 9:00 AM

## 2016-12-05 ENCOUNTER — Encounter (HOSPITAL_COMMUNITY): Payer: Self-pay | Admitting: Cardiovascular Disease

## 2016-12-13 ENCOUNTER — Telehealth: Payer: Self-pay | Admitting: Physician Assistant

## 2016-12-13 NOTE — Telephone Encounter (Signed)
[facility] sent rx FYI that patient is getting both simvastatin and atorvastatin. We stopped simvastatin previously. Please confirm with pt that she is no longer taking this. Dayna Dunn PA-C

## 2016-12-13 NOTE — Telephone Encounter (Signed)
Call placed to pt to verify that she is only taking the Atorvastatin and the Simvastatin has been d/c'd? Pt did advise that she is only taking Atorvastatin.

## 2016-12-25 ENCOUNTER — Ambulatory Visit: Payer: BC Managed Care – PPO | Admitting: Adult Health

## 2017-04-16 ENCOUNTER — Other Ambulatory Visit (HOSPITAL_COMMUNITY): Payer: Self-pay | Admitting: Obstetrics and Gynecology

## 2017-04-16 DIAGNOSIS — Z1231 Encounter for screening mammogram for malignant neoplasm of breast: Secondary | ICD-10-CM

## 2017-05-16 ENCOUNTER — Ambulatory Visit (HOSPITAL_COMMUNITY)
Admission: RE | Admit: 2017-05-16 | Discharge: 2017-05-16 | Disposition: A | Discharge status: Home / Self Care | Payer: BC Managed Care – PPO | Source: Ambulatory Visit | Attending: Obstetrics and Gynecology | Admitting: Obstetrics and Gynecology

## 2017-05-16 DIAGNOSIS — Z1231 Encounter for screening mammogram for malignant neoplasm of breast: Secondary | ICD-10-CM | POA: Insufficient documentation

## 2017-07-12 ENCOUNTER — Other Ambulatory Visit (HOSPITAL_COMMUNITY): Payer: Self-pay | Admitting: Physician Assistant

## 2017-07-12 ENCOUNTER — Ambulatory Visit (HOSPITAL_COMMUNITY)
Admission: RE | Admit: 2017-07-12 | Discharge: 2017-07-12 | Disposition: A | Discharge status: Home / Self Care | Payer: BC Managed Care – PPO | Source: Ambulatory Visit | Attending: Physician Assistant | Admitting: Physician Assistant

## 2017-07-12 DIAGNOSIS — R1013 Epigastric pain: Secondary | ICD-10-CM | POA: Diagnosis not present

## 2017-07-12 IMAGING — US US ABDOMEN COMPLETE
1 series · 14 of 25 positions shown · non-contrast
Comparison: None.

CLINICAL DATA: Worsening epigastric pain for 4 days, nausea. Assess
gallbladder.

EXAM:
ABDOMEN ULTRASOUND COMPLETE

[Series 1: us abdomen complete · 0.22mm/px · 14 of 89 slices shown]
[im 1/89]
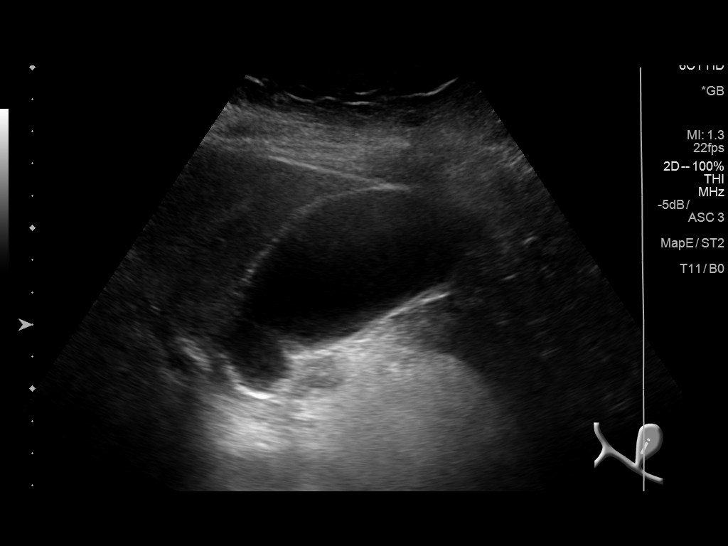
[im 8/89]
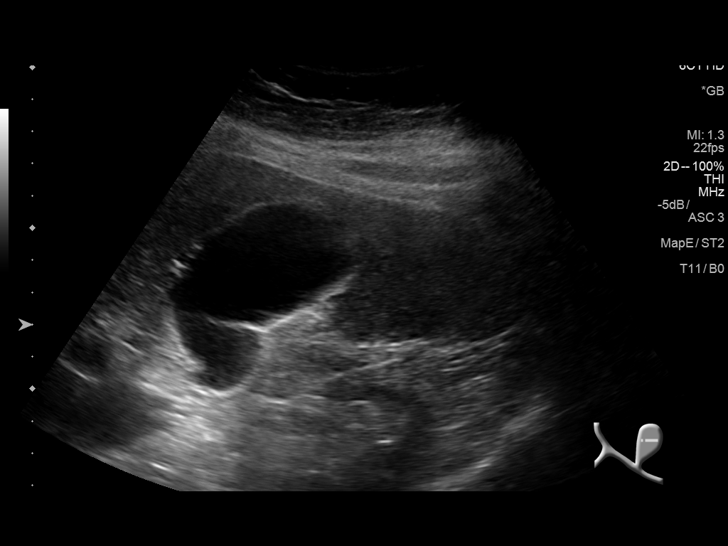
[im 15/89]
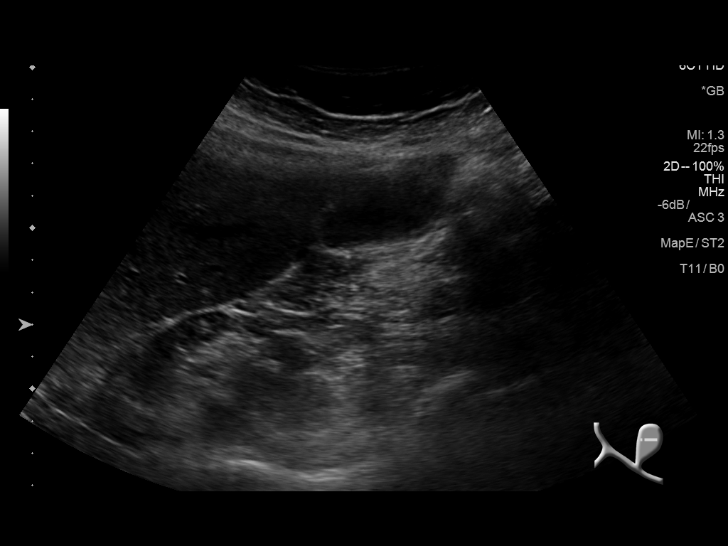
[im 23/89]
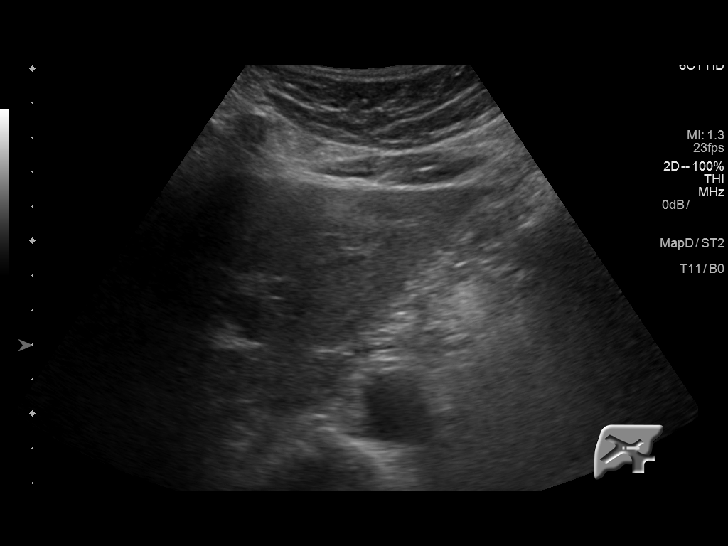
[im 30/89]
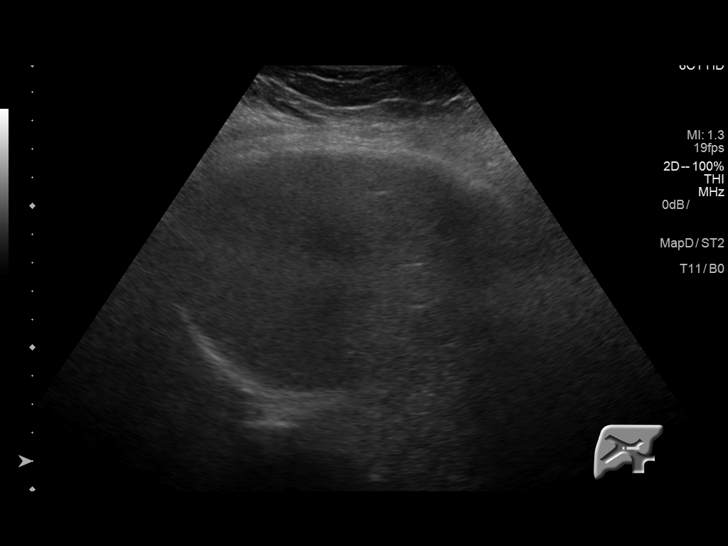
[im 34/89]
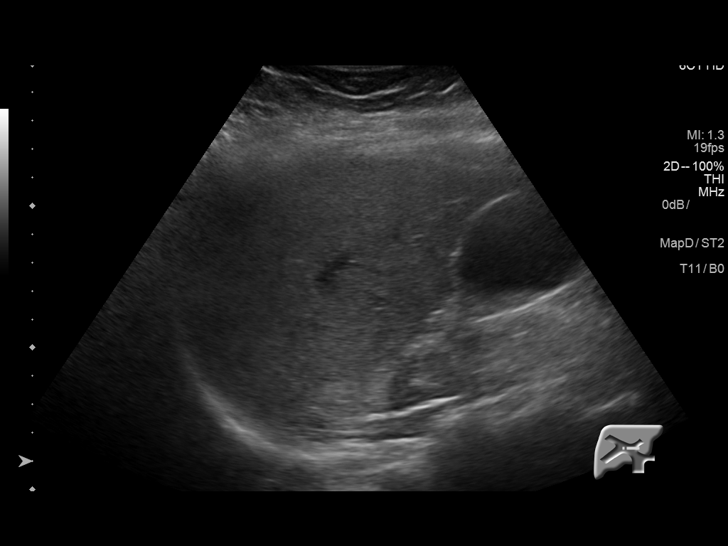
[im 41/89]
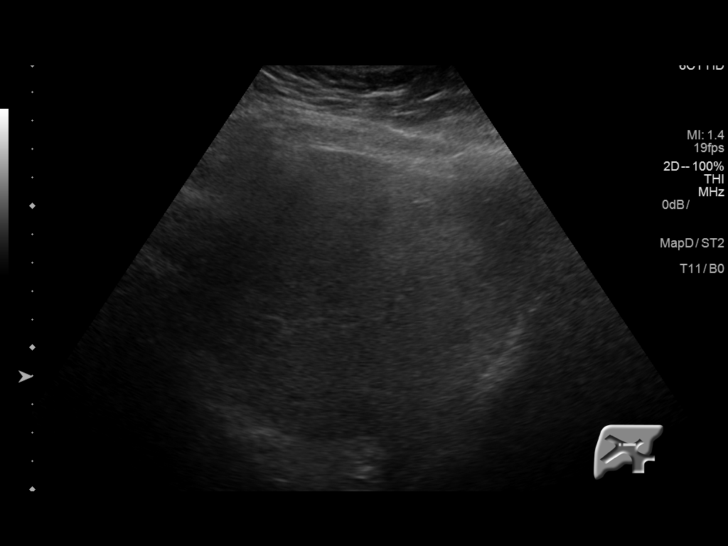
[im 48/89]
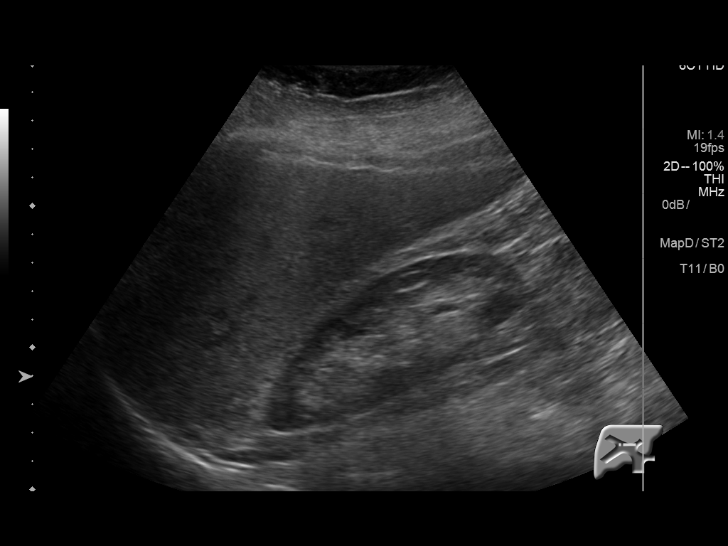
[im 56/89]
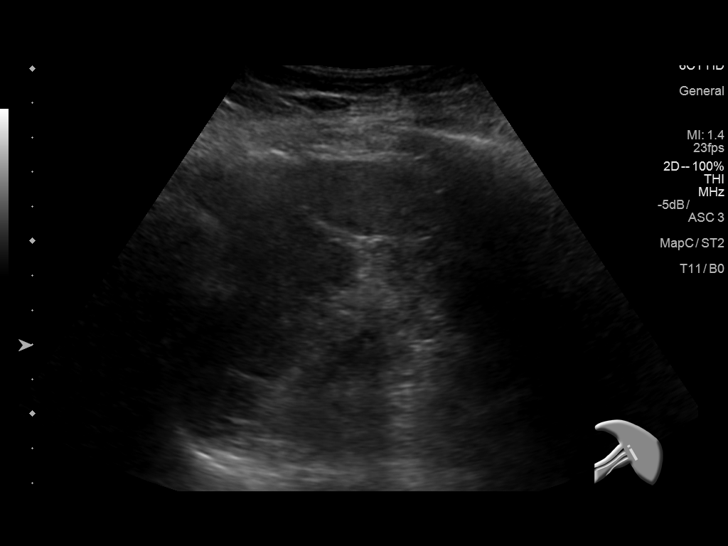
[im 59/89]
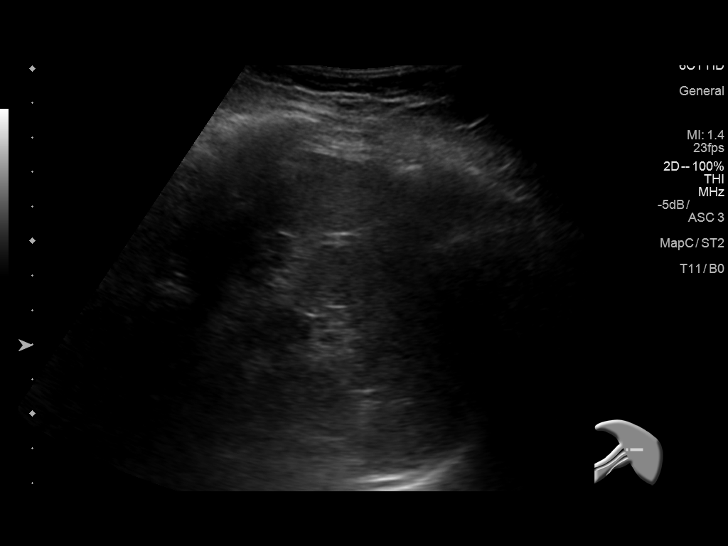
[im 67/89]
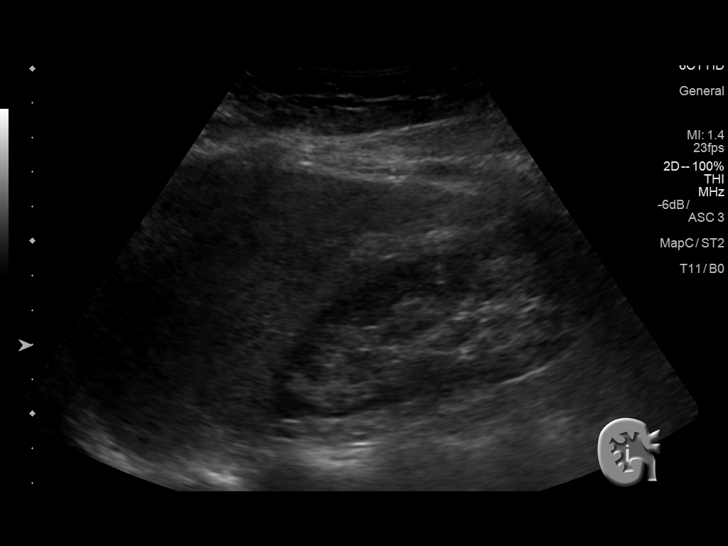
[im 74/89]
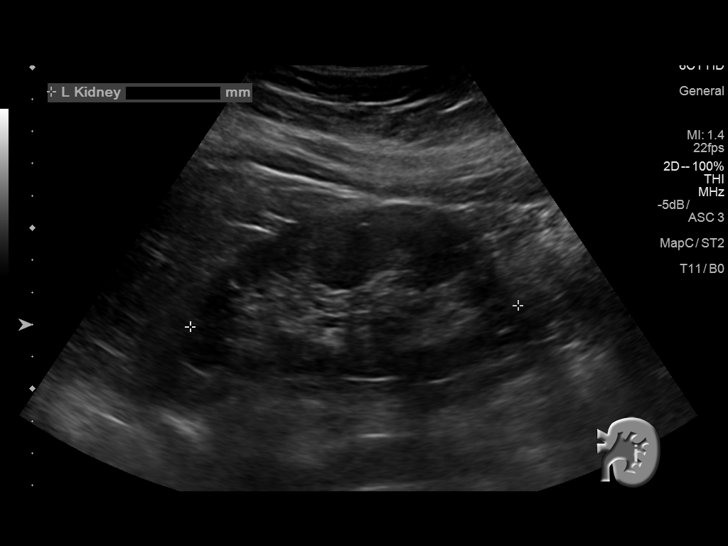
[im 81/89]
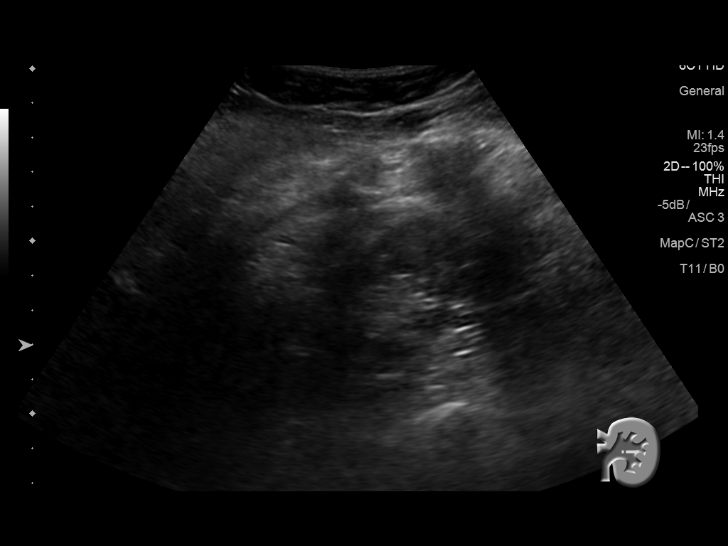
[im 89/89]
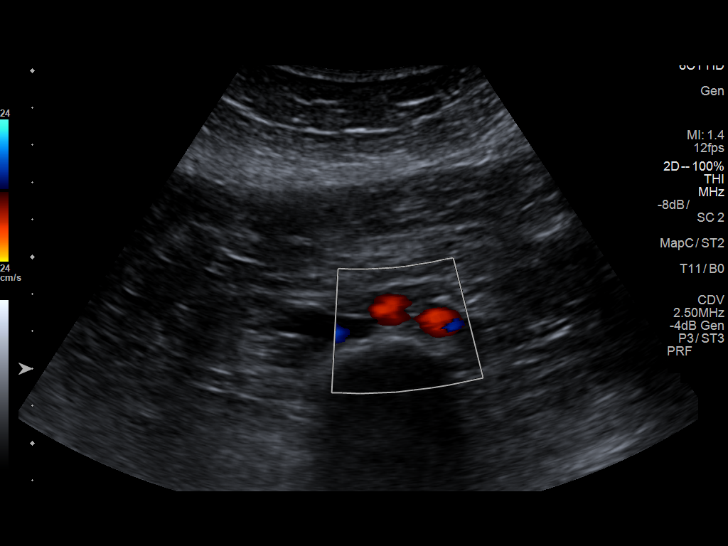

[14 of 25 positions shown; findings below may reference images not displayed]

FINDINGS: Gallbladder: No gallstones or wall thickening visualized. No
sonographic Murphy sign noted by sonographer.

Common bile duct: Diameter: 3 mm

Liver: No focal lesion identified. Within normal limits in
parenchymal echogenicity. Portal vein is patent on color Doppler
imaging with normal direction of blood flow towards the liver.

IVC: No abnormality visualized.

Pancreas: Visualized portion unremarkable.

Spleen: Size and appearance within normal limits.

Right Kidney: Length: 10.7 cm. Echogenicity within normal limits. No
mass or hydronephrosis visualized.

Left Kidney: Length: 10.2 cm. Echogenicity within normal limits. No
mass or hydronephrosis visualized.

Abdominal aorta: No aneurysm visualized.

Other findings: None.
IMPRESSION: Normal abdominal ultrasound.

## 2017-11-21 ENCOUNTER — Encounter (INDEPENDENT_AMBULATORY_CARE_PROVIDER_SITE_OTHER): Payer: Self-pay | Admitting: *Deleted

## 2018-01-10 ENCOUNTER — Other Ambulatory Visit (INDEPENDENT_AMBULATORY_CARE_PROVIDER_SITE_OTHER): Payer: Self-pay | Admitting: *Deleted

## 2018-01-10 DIAGNOSIS — Z86010 Personal history of colonic polyps: Secondary | ICD-10-CM

## 2018-04-01 ENCOUNTER — Telehealth (INDEPENDENT_AMBULATORY_CARE_PROVIDER_SITE_OTHER): Payer: Self-pay | Admitting: *Deleted

## 2018-04-01 ENCOUNTER — Encounter (INDEPENDENT_AMBULATORY_CARE_PROVIDER_SITE_OTHER): Payer: Self-pay | Admitting: *Deleted

## 2018-04-01 NOTE — Telephone Encounter (Signed)
Patient needs osmo pill 

## 2018-04-02 ENCOUNTER — Encounter (INDEPENDENT_AMBULATORY_CARE_PROVIDER_SITE_OTHER): Payer: Self-pay | Admitting: *Deleted

## 2018-04-02 ENCOUNTER — Telehealth (INDEPENDENT_AMBULATORY_CARE_PROVIDER_SITE_OTHER): Payer: Self-pay | Admitting: *Deleted

## 2018-04-02 MED ORDER — SOD PHOS MONO-SOD PHOS DIBASIC 1.102-0.398 G PO TABS: 1.0000 | ORAL_TABLET | Freq: Once | ORAL | 0 refills | Status: AC

## 2018-04-02 NOTE — Telephone Encounter (Signed)
Patient needs suprep 

## 2018-04-03 MED ORDER — SUPREP BOWEL PREP KIT 17.5-3.13-1.6 GM/177ML PO SOLN: 1.0000 | Freq: Once | ORAL | 0 refills | Status: AC

## 2018-04-12 ENCOUNTER — Telehealth (INDEPENDENT_AMBULATORY_CARE_PROVIDER_SITE_OTHER): Payer: Self-pay | Admitting: *Deleted

## 2018-04-12 NOTE — Telephone Encounter (Signed)
Referring MD/PCP: golding   Procedure: tcs  Reason/Indication:  Hx polyps  Has patient had this procedure before?  yes  If so, when, by whom and where?    Is there a family history of colon cancer?  no  Who?  What age when diagnosed?    Is patient diabetic?   no      Does patient have prosthetic heart valve or mechanical valve?  no  Do you have a pacemaker?  no  Has patient ever had endocarditis? no  Has patient had joint replacement within last 12 months?  no  Is patient constipated or do they take laxatives? no  Does patient have a history of alcohol/drug use?  no  Is patient on blood thinner such as Coumadin, Plavix and/or Aspirin? yes  Medications: asa 81 mg daily, lipitor 20 mg daily, celexa 20 mg daily, prilosec 20 mg every other day  Allergies: azithromycin, flexeril  Medication Adjustment per Dr Keane Police, NP: asa 2 days  Procedure date & time: 05/15/18 at 730

## 2018-04-16 NOTE — Telephone Encounter (Signed)
agree

## 2018-05-15 ENCOUNTER — Encounter (HOSPITAL_COMMUNITY): Payer: Self-pay

## 2018-05-15 ENCOUNTER — Other Ambulatory Visit: Payer: Self-pay

## 2018-05-15 ENCOUNTER — Encounter (HOSPITAL_COMMUNITY)
Admission: RE | Disposition: A | Discharge status: Home / Self Care | Payer: Self-pay | Source: Home / Self Care | Attending: Internal Medicine

## 2018-05-15 ENCOUNTER — Ambulatory Visit (HOSPITAL_COMMUNITY)
Admission: RE | Admit: 2018-05-15 | Discharge: 2018-05-15 | Disposition: A | Discharge status: Home / Self Care | Payer: BC Managed Care – PPO | Attending: Internal Medicine | Admitting: Internal Medicine

## 2018-05-15 DIAGNOSIS — I2510 Atherosclerotic heart disease of native coronary artery without angina pectoris: Secondary | ICD-10-CM | POA: Insufficient documentation

## 2018-05-15 DIAGNOSIS — E785 Hyperlipidemia, unspecified: Secondary | ICD-10-CM | POA: Insufficient documentation

## 2018-05-15 DIAGNOSIS — E669 Obesity, unspecified: Secondary | ICD-10-CM | POA: Insufficient documentation

## 2018-05-15 DIAGNOSIS — Z6831 Body mass index (BMI) 31.0-31.9, adult: Secondary | ICD-10-CM | POA: Insufficient documentation

## 2018-05-15 DIAGNOSIS — Z1211 Encounter for screening for malignant neoplasm of colon: Secondary | ICD-10-CM | POA: Insufficient documentation

## 2018-05-15 DIAGNOSIS — K644 Residual hemorrhoidal skin tags: Secondary | ICD-10-CM

## 2018-05-15 DIAGNOSIS — Z860100 Personal history of colon polyps, unspecified: Secondary | ICD-10-CM | POA: Insufficient documentation

## 2018-05-15 DIAGNOSIS — Z7982 Long term (current) use of aspirin: Secondary | ICD-10-CM | POA: Insufficient documentation

## 2018-05-15 DIAGNOSIS — F329 Major depressive disorder, single episode, unspecified: Secondary | ICD-10-CM | POA: Diagnosis not present

## 2018-05-15 DIAGNOSIS — Z79899 Other long term (current) drug therapy: Secondary | ICD-10-CM | POA: Insufficient documentation

## 2018-05-15 HISTORY — PX: COLONOSCOPY: SHX5424

## 2018-05-15 SURGERY — COLONOSCOPY
Anesthesia: Moderate Sedation

## 2018-05-15 MED ORDER — LIDOCAINE HCL URETHRAL/MUCOSAL 2 % EX GEL
CUTANEOUS | Status: AC
  Filled 2018-05-15: qty 30

## 2018-05-15 MED ORDER — SODIUM CHLORIDE 0.9 % IV SOLN
INTRAVENOUS | Status: DC
  Administered 2018-05-15: 07:00:00 via INTRAVENOUS

## 2018-05-15 MED ORDER — MEPERIDINE HCL 50 MG/ML IJ SOLN
INTRAMUSCULAR | Status: AC
  Filled 2018-05-15: qty 1

## 2018-05-15 MED ORDER — MEPERIDINE HCL 50 MG/ML IJ SOLN
INTRAMUSCULAR | Status: DC | PRN
  Administered 2018-05-15 (×2): 25 mg via INTRAVENOUS

## 2018-05-15 MED ORDER — MIDAZOLAM HCL 5 MG/5ML IJ SOLN
INTRAMUSCULAR | Status: AC
  Filled 2018-05-15: qty 10

## 2018-05-15 MED ORDER — STERILE WATER FOR IRRIGATION IR SOLN
Status: DC | PRN
  Administered 2018-05-15: 1.5 mL

## 2018-05-15 MED ORDER — MIDAZOLAM HCL 5 MG/5ML IJ SOLN
INTRAMUSCULAR | Status: DC | PRN
  Administered 2018-05-15: 1 mg via INTRAVENOUS
  Administered 2018-05-15: 2 mg via INTRAVENOUS
  Administered 2018-05-15 (×2): 1 mg via INTRAVENOUS
  Administered 2018-05-15: 2 mg via INTRAVENOUS
  Administered 2018-05-15: 1 mg via INTRAVENOUS

## 2018-05-15 NOTE — H&P (Signed)
Sara Lindsey is an 63 y.o. female.   Chief Complaint: Patient is here for colonoscopy. HPI: Patient is 63 year old Caucasian female who is here for screening colonoscopy.  Her last exam was 11 years ago at [facility] in [facility] revealing 2 small polyps and these were hyperplastic.  Patient was advised to return in 10 years.  She denies abdominal pain change in bowel habits or rectal bleeding. Last aspirin dose was 4 days ago. Family history is negative for CRC.   Past Medical History:  Diagnosis Date  . Depression   . Elevated blood pressure reading without diagnosis of hypertension   . Hyperlipidemia   . Mild CAD    a. 11/2016 - cardiac catheterization today showing minor nonobstructive CAD with very mild stenosis at the LAD/diagonal bifurcation, otherwise normal coronary arteries. Normal LVEDP.  Marland Kitchen Obesity     Past Surgical History:  Procedure Laterality Date  . ABDOMINAL HYSTERECTOMY    . LEFT HEART CATH AND CORONARY ANGIOGRAPHY N/A 12/04/2016   Procedure: LEFT HEART CATH AND CORONARY ANGIOGRAPHY;  Surgeon: Tonny Bollman, MD;  Location: [facility] INVASIVE CV LAB;  Service: Cardiovascular;  Laterality: N/A;    Family History  Problem Relation Age of Onset  . Arrhythmia Father   . Heart attack Maternal Grandmother 107       died   Social History:  reports that she has never smoked. She has never used smokeless tobacco. She reports that she does not drink alcohol or use drugs.  Allergies:  Allergies  Allergen Reactions  . Azithromycin Rash  . Flexeril [Cyclobenzaprine] Palpitations and Other (See Comments)    Patient reports that it also caused dizziness    Medications Prior to Admission  Medication Sig Dispense Refill  . acetaminophen (TYLENOL) 500 MG tablet Take 1,000 mg by mouth every 6 (six) hours as needed for moderate pain or headache.    Marland Kitchen aspirin 81 MG EC tablet Take 1 tablet (81 mg total) by mouth daily.    Marland Kitchen atorvastatin (LIPITOR) 20 MG tablet Take 20 mg by mouth at  bedtime.    . citalopram (CELEXA) 20 MG tablet Take 20 mg by mouth at bedtime.    . conjugated estrogens (PREMARIN) vaginal cream Place 1 Applicatorful vaginally daily as needed (dryness).    . Omega-3 Fatty Acids (OMEGA 3 PO) Take 1 capsule by mouth daily.    Marland Kitchen omeprazole (PRILOSEC) 20 MG capsule Take 1 capsule (20 mg total) by mouth daily. 30 capsule 0  . atorvastatin (LIPITOR) 40 MG tablet Take 1 tablet (40 mg total) by mouth every evening. (Patient not taking: Reported on 04/30/2018) 30 tablet 2    No results found for this or any previous visit (from the past 48 hour(s)). No results found.  ROS  Blood pressure (!) 108/56, pulse 73, temperature 97.7 F (36.5 C), temperature source Oral, resp. rate 16, height 5\' 3"  (1.6 m), weight 79.4 kg, SpO2 98 %. Physical Exam  Constitutional: She appears well-developed and well-nourished.  HENT:  Mouth/Throat: Oropharynx is clear and moist.  Eyes: Conjunctivae are normal. No scleral icterus.  Neck: No thyromegaly present.  Cardiovascular: Normal rate, regular rhythm and normal heart sounds.  No murmur heard. Respiratory: Effort normal and breath sounds normal.  GI:  Abdomen is symmetrical with small infraumbilical scar.  Abdomen is soft and nontender with organomegaly or masses.  Musculoskeletal:        General: No edema.  Lymphadenopathy:    She has no cervical adenopathy.  Neurological:  She is alert.  Skin: Skin is warm and dry.     Assessment/Plan Average risk screening colonoscopy.  Lionel December, MD 05/15/2018, 7:31 AM

## 2018-05-15 NOTE — Discharge Instructions (Signed)
Hemorrhoids Hemorrhoids are swollen veins that may develop:  In the butt (rectum). These are called internal hemorrhoids.  Around the opening of the butt (anus). These are called external hemorrhoids. Hemorrhoids can cause pain, itching, or bleeding. Most of the time, they do not cause serious problems. They usually get better with diet changes, lifestyle changes, and other home treatments. What are the causes? This condition may be caused by:  Having trouble pooping (constipation).  Pushing hard (straining) to poop.  Watery poop (diarrhea).  Pregnancy.  Being very overweight (obese).  Sitting for long periods of time.  Heavy lifting or other activity that causes you to strain.  Anal sex.  Riding a bike for a long period of time. What are the signs or symptoms? Symptoms of this condition include:  Pain.  Itching or soreness in the butt.  Bleeding from the butt.  Leaking poop.  Swelling in the area.  One or more lumps around the opening of your butt. How is this diagnosed? A doctor can often diagnose this condition by looking at the affected area. The doctor may also:  Do an exam that involves feeling the area with a gloved hand (digital rectal exam).  Examine the area inside your butt using a small tube (anoscope).  Order blood tests. This may be done if you have lost a lot of blood.  Have you get a test that involves looking inside the colon using a flexible tube with a camera on the end (sigmoidoscopy or colonoscopy). How is this treated? This condition can usually be treated at home. Your doctor may tell you to change what you eat, make lifestyle changes, or try home treatments. If these do not help, procedures can be done to remove the hemorrhoids or make them smaller. These may involve:  Placing rubber bands at the base of the hemorrhoids to cut off their blood supply.  Injecting medicine into the hemorrhoids to shrink them.  Shining a type of light  energy onto the hemorrhoids to cause them to fall off.  Doing surgery to remove the hemorrhoids or cut off their blood supply. Follow these instructions at home: Eating and drinking   Eat foods that have a lot of fiber in them. These include whole grains, beans, nuts, fruits, and vegetables.  Ask your doctor about taking products that have added fiber (fibersupplements).  Reduce the amount of fat in your diet. You can do this by: ? Eating low-fat dairy products. ? Eating less red meat. ? Avoiding processed foods.  Drink enough fluid to keep your pee (urine) pale yellow. Managing pain and swelling   Take a warm-water bath (sitz bath) for 20 minutes to ease pain. Do this 3-4 times a day. You may do this in a bathtub or using a portable sitz bath that fits over the toilet.  If told, put ice on the painful area. It may be helpful to use ice between your warm baths. ? Put ice in a plastic bag. ? Place a towel between your skin and the bag. ? Leave the ice on for 20 minutes, 2-3 times a day. General instructions  Take over-the-counter and prescription medicines only as told by your doctor. ? Medicated creams and medicines may be used as told.  Exercise often. Ask your doctor how much and what kind of exercise is best for you.  Go to the bathroom when you have the urge to poop. Do not wait.  Avoid pushing too hard when you poop.  Keep your  butt dry and clean. Use wet toilet paper or moist towelettes after pooping.  Do not sit on the toilet for a long time.  Keep all follow-up visits as told by your doctor. This is important. Contact a doctor if you:  Have pain and swelling that do not get better with treatment or medicine.  Have trouble pooping.  Cannot poop.  Have pain or swelling outside the area of the hemorrhoids. Get help right away if you have:  Bleeding that will not stop. Summary  Hemorrhoids are swollen veins in the butt or around the opening of the  butt.  They can cause pain, itching, or bleeding.  Eat foods that have a lot of fiber in them. These include whole grains, beans, nuts, fruits, and vegetables.  Take a warm-water bath (sitz bath) for 20 minutes to ease pain. Do this 3-4 times a day. This information is not intended to replace advice given to you by your health care provider. Make sure you discuss any questions you have with your health care provider. Document Released: 12/21/2007 Document Revised: 08/02/2017 Document Reviewed: 08/02/2017 Elsevier Interactive Patient Education  2019 [employer]. Colonoscopy, Adult, Care After This sheet gives you information about how to care for yourself after your procedure. Your health care provider may also give you more specific instructions. If you have problems or questions, contact your health care provider. What can I expect after the procedure? After the procedure, it is common to have:  A small amount of blood in your stool for 24 hours after the procedure.  Some gas.  Mild abdominal cramping or bloating. Follow these instructions at home: General instructions  For the first 24 hours after the procedure: ? Do not drive or use machinery. ? Do not sign important documents. ? Do not drink alcohol. ? Do your regular daily activities at a slower pace than normal. ? Eat soft, easy-to-digest foods.  Take over-the-counter or prescription medicines only as told by your health care provider. Relieving cramping and bloating   Try walking around when you have cramps or feel bloated.  Apply heat to your abdomen as told by your health care provider. Use a heat source that your health care provider recommends, such as a moist heat pack or a heating pad. ? Place a towel between your skin and the heat source. ? Leave the heat on for 20-30 minutes. ? Remove the heat if your skin turns bright red. This is especially important if you are unable to feel pain, heat, or cold. You may have  a greater risk of getting burned. Eating and drinking   Drink enough fluid to keep your urine pale yellow.  Resume your normal diet as instructed by your health care provider. Avoid heavy or fried foods that are hard to digest.  Avoid drinking alcohol for as long as instructed by your health care provider. Contact a health care provider if:  You have blood in your stool 2-3 days after the procedure. Get help right away if:  You have more than a small spotting of blood in your stool.  You pass large blood clots in your stool.  Your abdomen is swollen.  You have nausea or vomiting.  You have a fever.  You have increasing abdominal pain that is not relieved with medicine. Summary  After the procedure, it is common to have a small amount of blood in your stool. You may also have mild abdominal cramping and bloating.  For the first 24 hours after  the procedure, do not drive or use machinery, sign important documents, or drink alcohol.  Contact your health care provider if you have a lot of blood in your stool, nausea or vomiting, a fever, or increased abdominal pain. This information is not intended to replace advice given to you by your health care provider. Make sure you discuss any questions you have with your health care provider. Document Released: 10/26/2003 Document Revised: 01/03/2017 Document Reviewed: 05/25/2015 Elsevier Interactive Patient Education  2019 [employer]. Resume usual medications including aspirin as before. Resume usual diet. No driving for 24 hours. Next screening exam in 10 years.

## 2018-05-15 NOTE — Op Note (Signed)
[facility] Patient Name: Sara Lindsey Procedure Date: 05/15/2018 7:10 AM MRN: [id number] Date of Birth: Sep 04, 1955 Attending MD: Lionel December , MD CSN: [id number] Age: 63 Admit Type: Outpatient Procedure:                Colonoscopy Indications:              Screening for colorectal malignant neoplasm Providers:                Lionel December, MD, Nena Polio, RN, Dyann Ruddle Referring MD:             Corrie Mckusick, MD Medicines:                Meperidine 50 mg IV, Midazolam 8 mg IV and 2%                            lidocaine jelly to anal canal. Complications:            No immediate complications. Estimated Blood Loss:     Estimated blood loss: none. Procedure:                Pre-Anesthesia Assessment:                           - Prior to the procedure, a History and Physical                            was performed, and patient medications and                            allergies were reviewed. The patient's tolerance of                            previous anesthesia was also reviewed. The risks                            and benefits of the procedure and the sedation                            options and risks were discussed with the patient.                            All questions were answered, and informed consent                            was obtained. Prior Anticoagulants: The patient                            last took aspirin 4 days prior to the procedure.                            ASA Grade Assessment: II - A patient with mild                            systemic disease. After reviewing the risks and  benefits, the patient was deemed in satisfactory                            condition to undergo the procedure.                           After obtaining informed consent, the colonoscope                            was passed under direct vision. Throughout the                            procedure, the patient's blood pressure, pulse, and                            oxygen saturations were monitored continuously. The                            [id number]-[id number] ([id number]) scope was introduced through                            the anus and advanced to the the cecum, identified                            by appendiceal orifice and ileocecal valve. The                            colonoscopy was somewhat difficult due to                            restricted mobility of the colon. Successful                            completion of the procedure was aided by changing                            the patient to a supine position and withdrawing                            and reinserting the scope. The patient tolerated                            the procedure well. The quality of the bowel                            preparation was excellent. The ileocecal valve,                            appendiceal orifice, and rectum were photographed. Scope In: 7:47:38 AM Scope Out: 8:07:30 AM Scope Withdrawal Time: 0 hours 8 minutes 59 seconds  Total Procedure Duration: 0 hours 19 minutes 52 seconds  Findings:      Skin tags were found on perianal exam.      The colon (entire examined portion) appeared normal.  External hemorrhoids were found during retroflexion. The hemorrhoids       were small. Impression:               - Perianal skin tags found on perianal exam.                           - The entire examined colon is normal.                           - External hemorrhoids.                           - No specimens collected. Moderate Sedation:      Moderate (conscious) sedation was administered by the endoscopy nurse       and supervised by the endoscopist. The following parameters were       monitored: oxygen saturation, heart rate, blood pressure, CO2       capnography and response to care. Total physician intraservice time was       32 minutes. Recommendation:           - Patient has a contact number available for                             emergencies. The signs and symptoms of potential                            delayed complications were discussed with the                            patient. Return to normal activities tomorrow.                            Written discharge instructions were provided to the                            patient.                           - Resume previous diet today.                           - Continue present medications.                           - Repeat colonoscopy in 10 years for screening                            purposes. Procedure Code(s):        --- Professional ---                           [phone], Colonoscopy, flexible; diagnostic, including                            collection of specimen(s) by brushing or washing,  when performed (separate procedure)                           [id number], Moderate sedation; each additional 15                            minutes intraservice time                           G0500, Moderate sedation services provided by the                            same physician or other qualified health care                            professional performing a gastrointestinal                            endoscopic service that sedation supports,                            requiring the presence of an independent trained                            observer to assist in the monitoring of the                            patient's level of consciousness and physiological                            status; initial 15 minutes of intra-service time;                            patient age 16 years or older (additional time may                            be reported with 17001, as appropriate) Diagnosis Code(s):        --- Professional ---                           K64.4, Residual hemorrhoidal skin tags                           Z12.11, Encounter for screening for malignant                            neoplasm of colon CPT copyright 2018  American Medical Association. All rights reserved. The codes documented in this report are preliminary and upon coder review may  be revised to meet current compliance requirements. Lionel December, MD Lionel December, MD 05/15/2018 8:17:30 AM This report has been signed electronically. Number of Addenda: 0

## 2018-05-17 ENCOUNTER — Encounter (HOSPITAL_COMMUNITY): Payer: Self-pay | Admitting: Internal Medicine

## 2018-08-30 ENCOUNTER — Other Ambulatory Visit (HOSPITAL_COMMUNITY): Payer: Self-pay | Admitting: Obstetrics and Gynecology

## 2018-08-30 DIAGNOSIS — Z1231 Encounter for screening mammogram for malignant neoplasm of breast: Secondary | ICD-10-CM

## 2018-09-05 ENCOUNTER — Ambulatory Visit (HOSPITAL_COMMUNITY)
Admission: RE | Admit: 2018-09-05 | Discharge: 2018-09-05 | Disposition: A | Discharge status: Home / Self Care | Payer: BC Managed Care – PPO | Source: Ambulatory Visit | Attending: Obstetrics and Gynecology | Admitting: Obstetrics and Gynecology

## 2018-09-05 ENCOUNTER — Other Ambulatory Visit: Payer: Self-pay

## 2018-09-05 DIAGNOSIS — Z1231 Encounter for screening mammogram for malignant neoplasm of breast: Secondary | ICD-10-CM | POA: Insufficient documentation

## 2018-09-05 IMAGING — MG DIGITAL SCREENING BILATERAL MAMMOGRAM WITH TOMO AND CAD
8 series · 8 of 24 positions shown · non-contrast
Comparison: Previous exam(s).

CLINICAL DATA: Screening.

EXAM:
DIGITAL SCREENING BILATERAL MAMMOGRAM WITH TOMO AND CAD

[L MLO synth-2D]
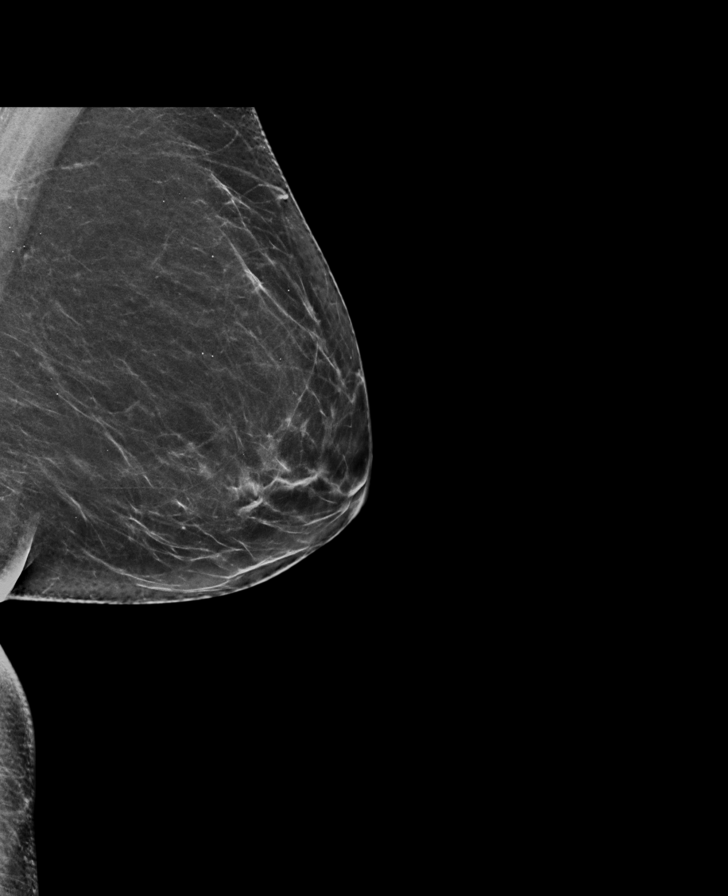

[R MLO synth-2D]
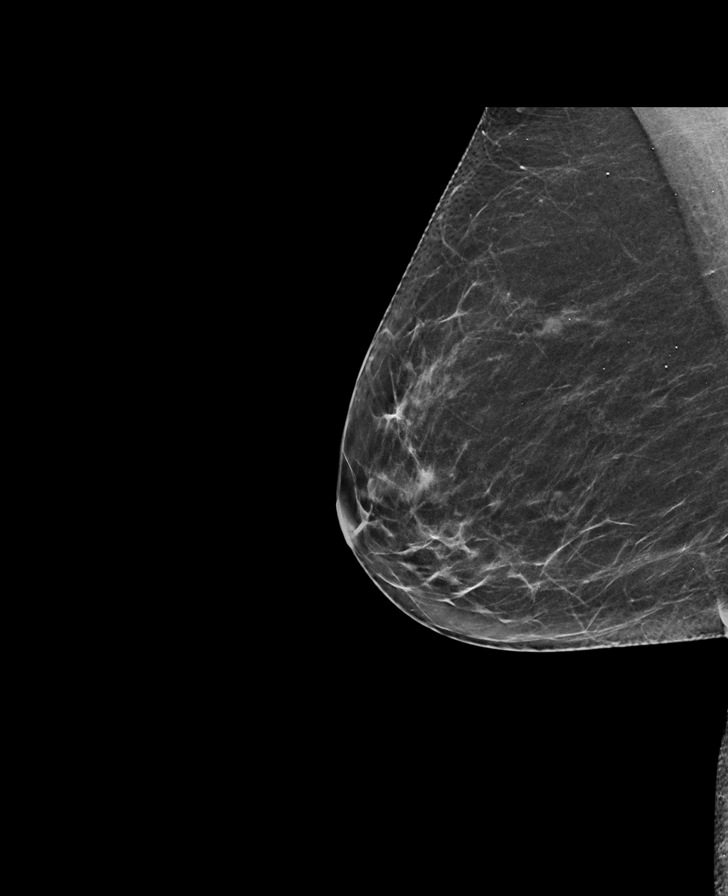

[R CC synth-2D]
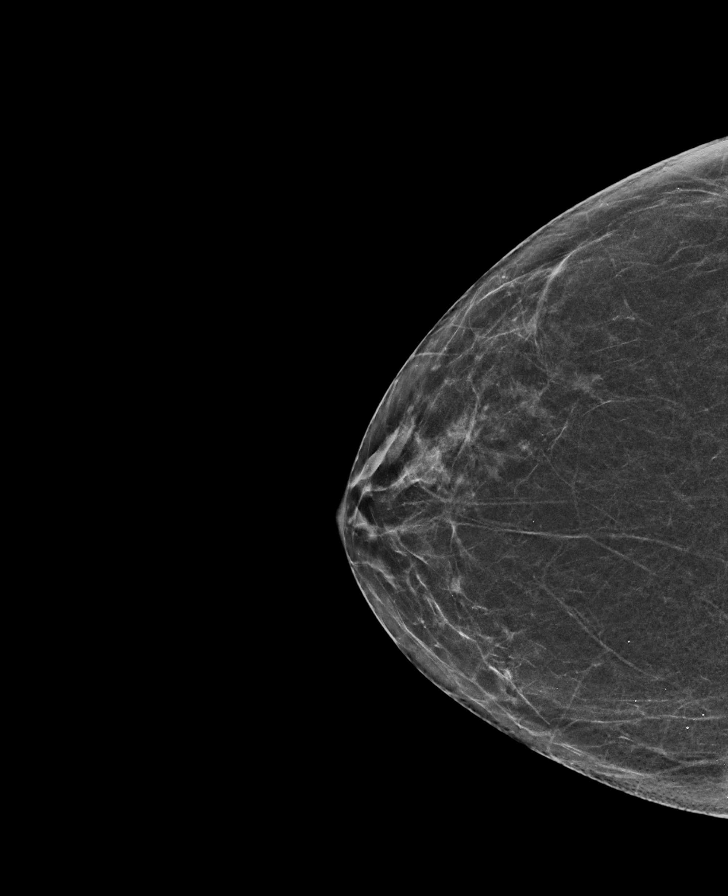

[L CC synth-2D]
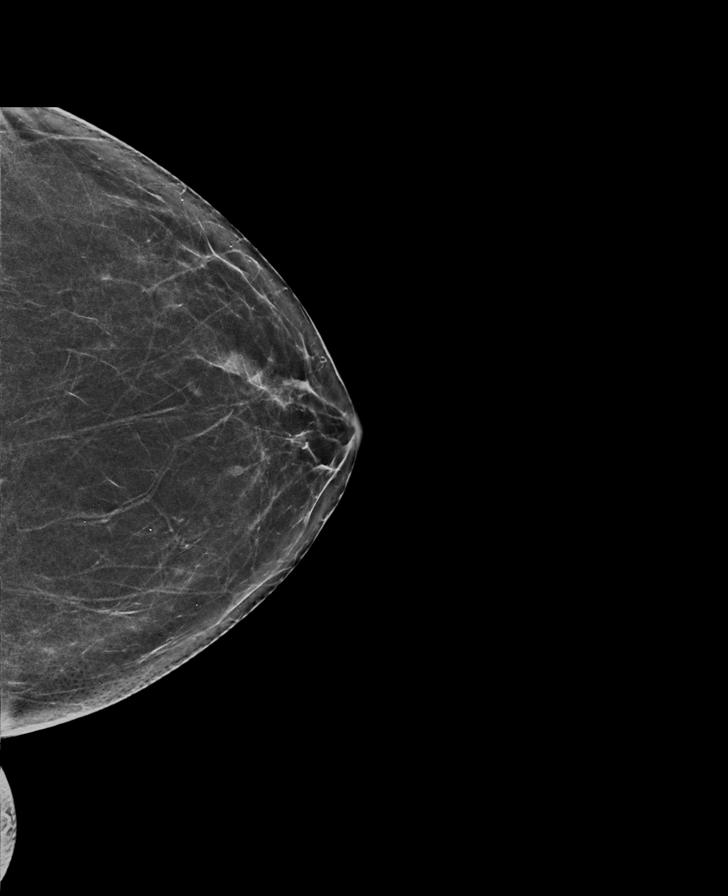

[R MLO tomo · tomo slice 33/64.0]
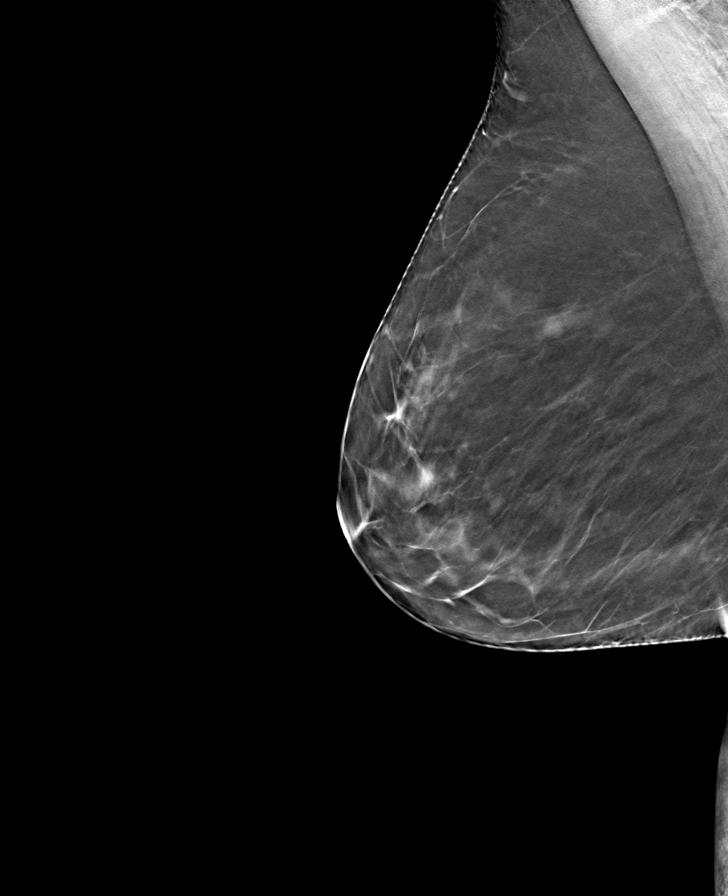

[R CC tomo · tomo slice 29/56.0]
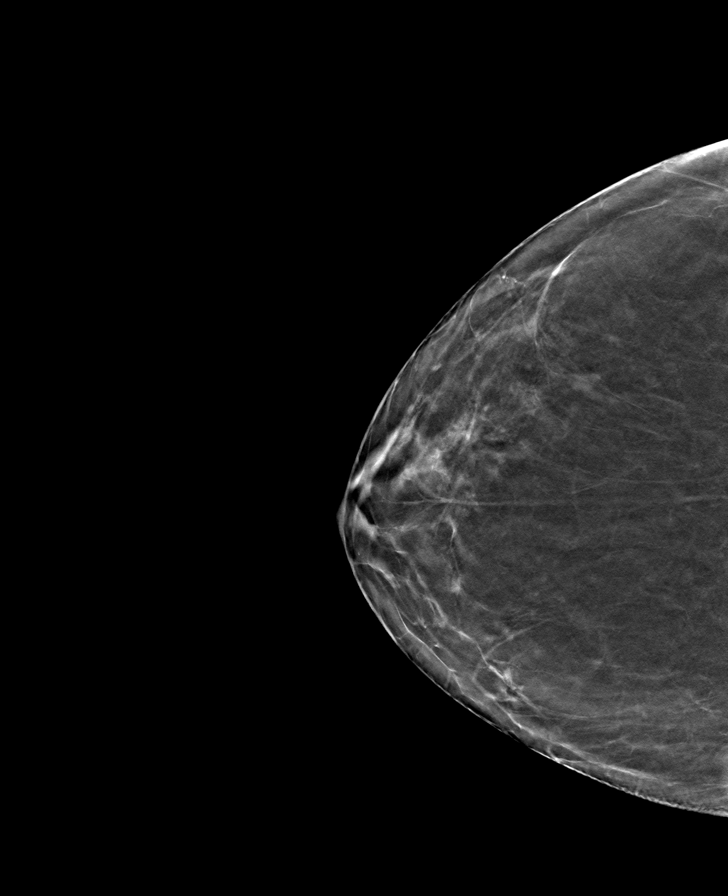

[L MLO tomo · tomo slice 34/67.0]
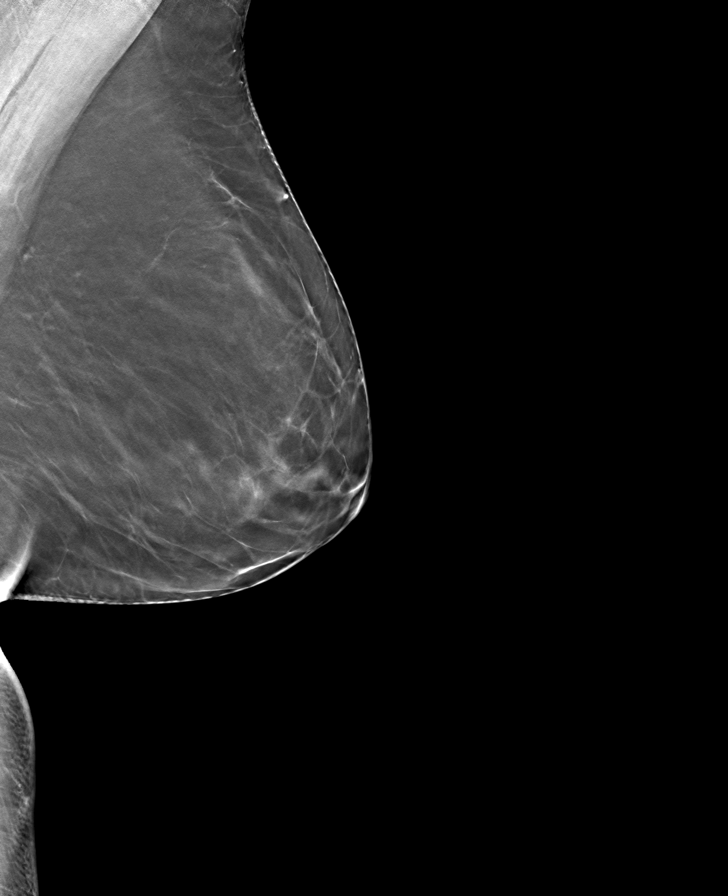

[L CC tomo · tomo slice 31/61.0]
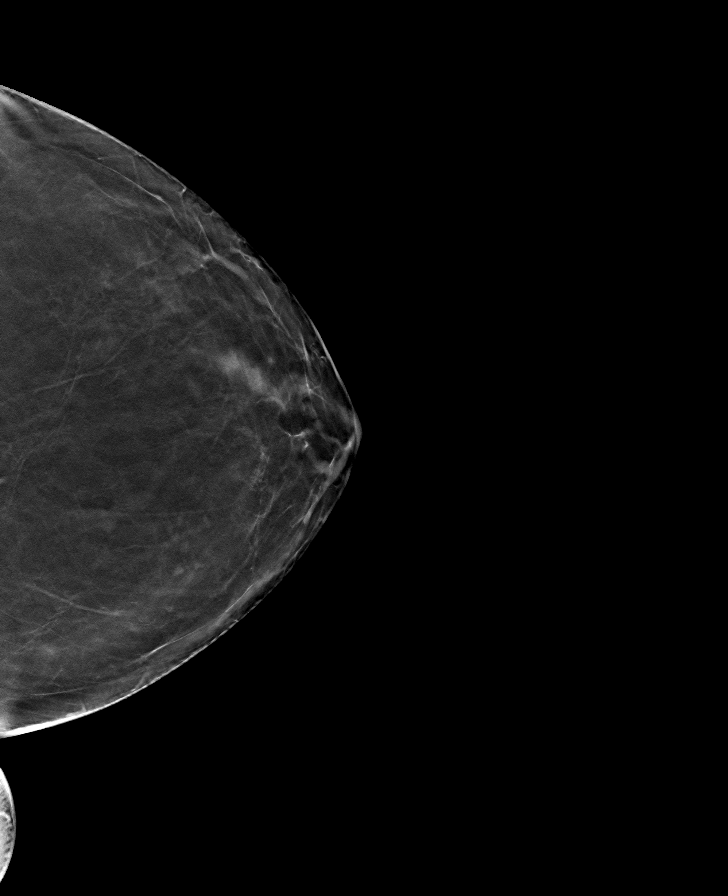

[8 of 24 positions shown; findings below may reference images not displayed]

ACR Breast Density Category b: There are scattered areas of
fibroglandular density.
FINDINGS: There are no findings suspicious for malignancy. Images were
processed with CAD.
IMPRESSION: No mammographic evidence of malignancy. A result letter of this
screening mammogram will be mailed directly to the patient.

RECOMMENDATION:
Screening mammogram in one year. (Code:CN-U-775)

BI-RADS CATEGORY  1: Negative.

## 2018-11-01 ENCOUNTER — Other Ambulatory Visit: Payer: Self-pay | Admitting: Internal Medicine

## 2018-11-01 DIAGNOSIS — Z20822 Contact with and (suspected) exposure to covid-19: Secondary | ICD-10-CM

## 2018-11-02 LAB — NOVEL CORONAVIRUS, NAA: SARS-CoV-2, NAA: NOT DETECTED

## 2019-08-11 ENCOUNTER — Other Ambulatory Visit (HOSPITAL_COMMUNITY): Payer: Self-pay | Admitting: Obstetrics and Gynecology

## 2019-08-11 DIAGNOSIS — Z1231 Encounter for screening mammogram for malignant neoplasm of breast: Secondary | ICD-10-CM

## 2019-09-05 LAB — TSH: TSH: 0.06 — AB (ref 0.41–5.90)

## 2019-09-08 ENCOUNTER — Ambulatory Visit (HOSPITAL_COMMUNITY): Payer: BC Managed Care – PPO

## 2019-09-10 ENCOUNTER — Other Ambulatory Visit: Payer: Self-pay

## 2019-09-10 ENCOUNTER — Encounter: Payer: Self-pay | Admitting: "Endocrinology

## 2019-09-10 ENCOUNTER — Ambulatory Visit (HOSPITAL_COMMUNITY)
Admission: RE | Admit: 2019-09-10 | Discharge: 2019-09-10 | Disposition: A | Discharge status: Home / Self Care | Payer: BC Managed Care – PPO | Source: Ambulatory Visit | Attending: Obstetrics and Gynecology | Admitting: Obstetrics and Gynecology

## 2019-09-10 ENCOUNTER — Ambulatory Visit (INDEPENDENT_AMBULATORY_CARE_PROVIDER_SITE_OTHER): Payer: BC Managed Care – PPO | Admitting: "Endocrinology

## 2019-09-10 VITALS — BP 108/66 | HR 65 | Ht 63.0 in | Wt 177.0 lb

## 2019-09-10 DIAGNOSIS — Z1231 Encounter for screening mammogram for malignant neoplasm of breast: Secondary | ICD-10-CM | POA: Diagnosis not present

## 2019-09-10 DIAGNOSIS — E0590 Thyrotoxicosis, unspecified without thyrotoxic crisis or storm: Secondary | ICD-10-CM

## 2019-09-10 DIAGNOSIS — R7989 Other specified abnormal findings of blood chemistry: Secondary | ICD-10-CM | POA: Insufficient documentation

## 2019-09-10 IMAGING — MG DIGITAL SCREENING BILAT W/ TOMO W/ CAD
8 series · 9 of 24 positions shown · non-contrast
Comparison: Previous exam(s).

CLINICAL DATA: Screening.

EXAM:
DIGITAL SCREENING BILATERAL MAMMOGRAM WITH TOMO AND CAD

[L MLO synth-2D]
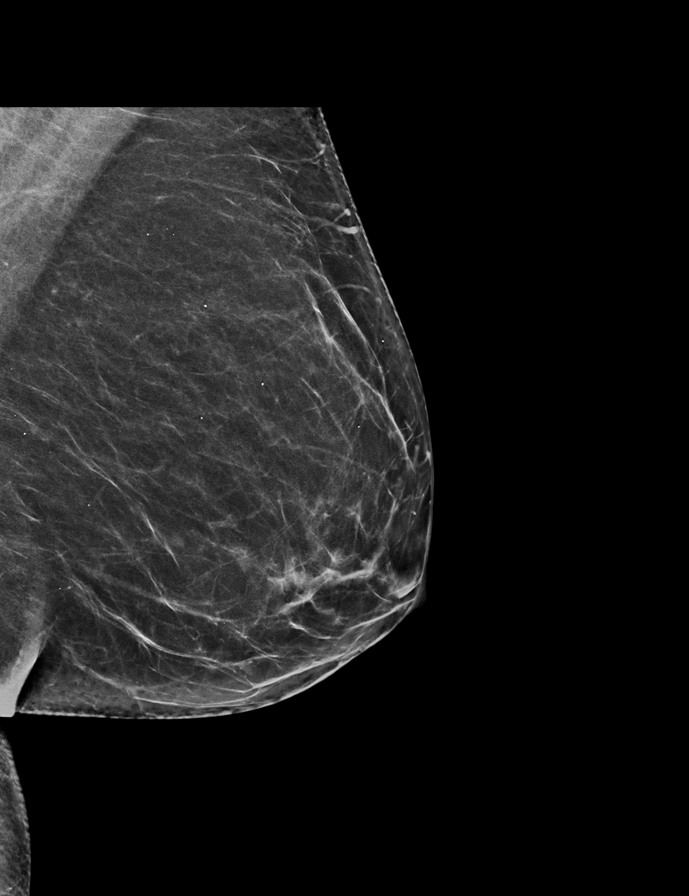

[L CC synth-2D]
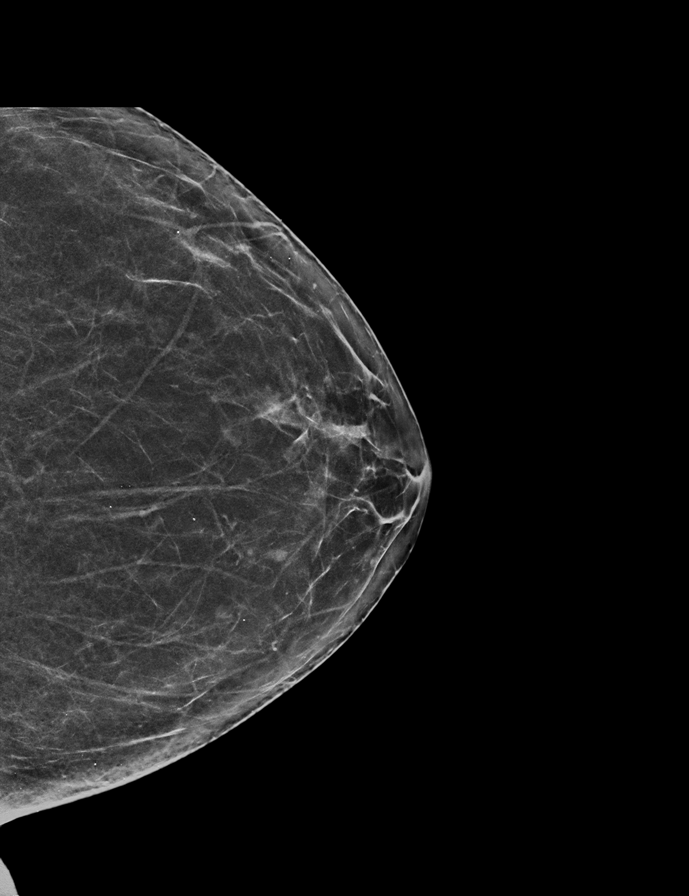

[R CC synth-2D]
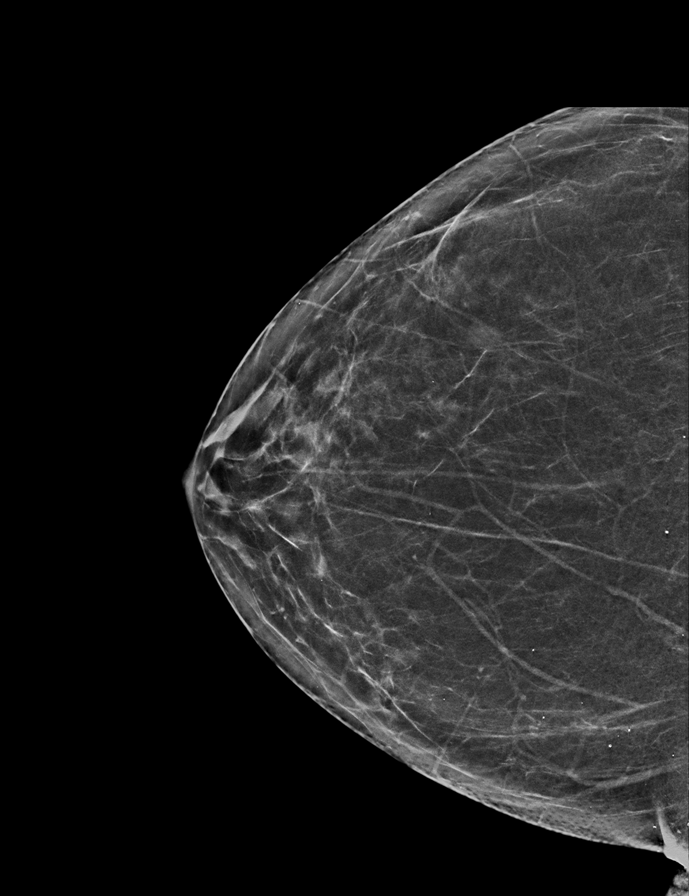

[R MLO synth-2D]
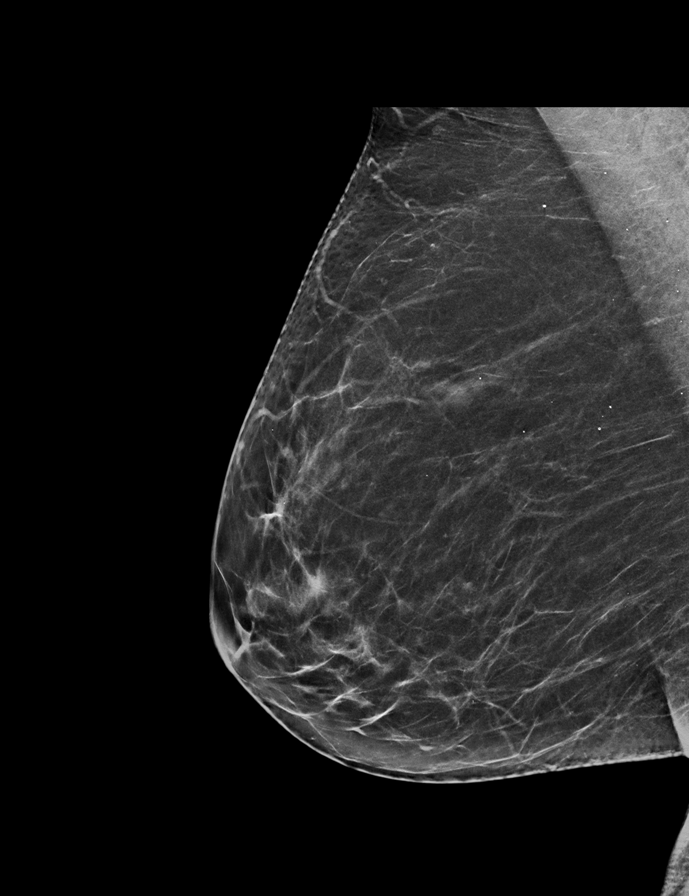

[L MLO tomo · 2 of 64 frames shown]
[frame 21/64]
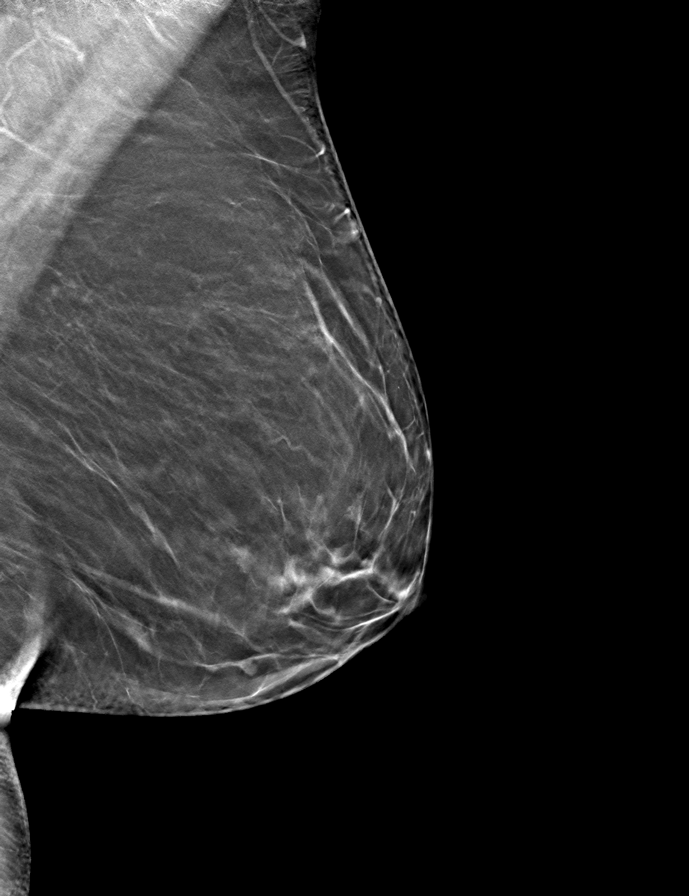
[frame 33/64]
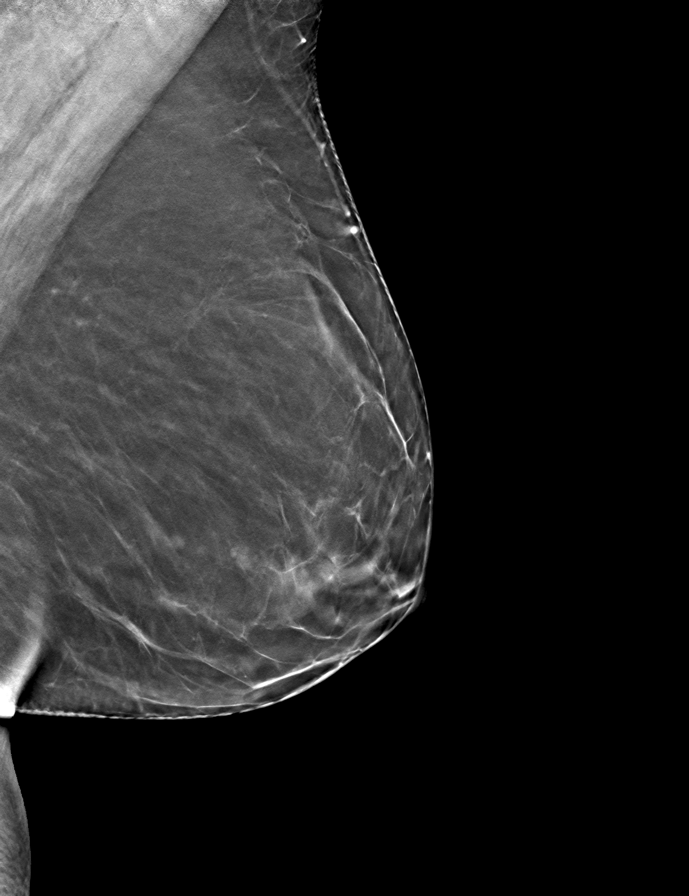

[L CC tomo · tomo slice 30/59.0]
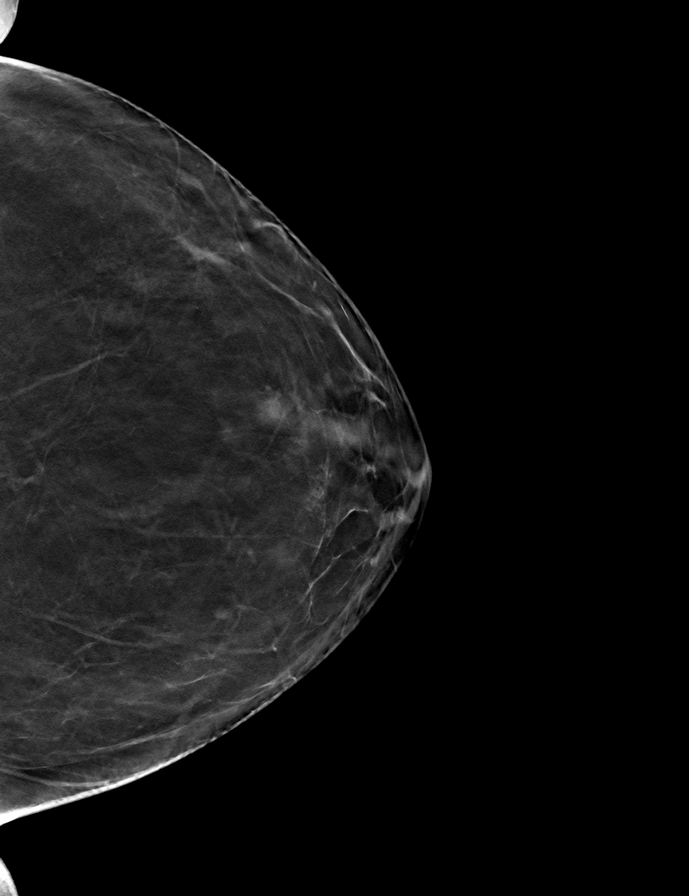

[R CC tomo · tomo slice 31/60.0]
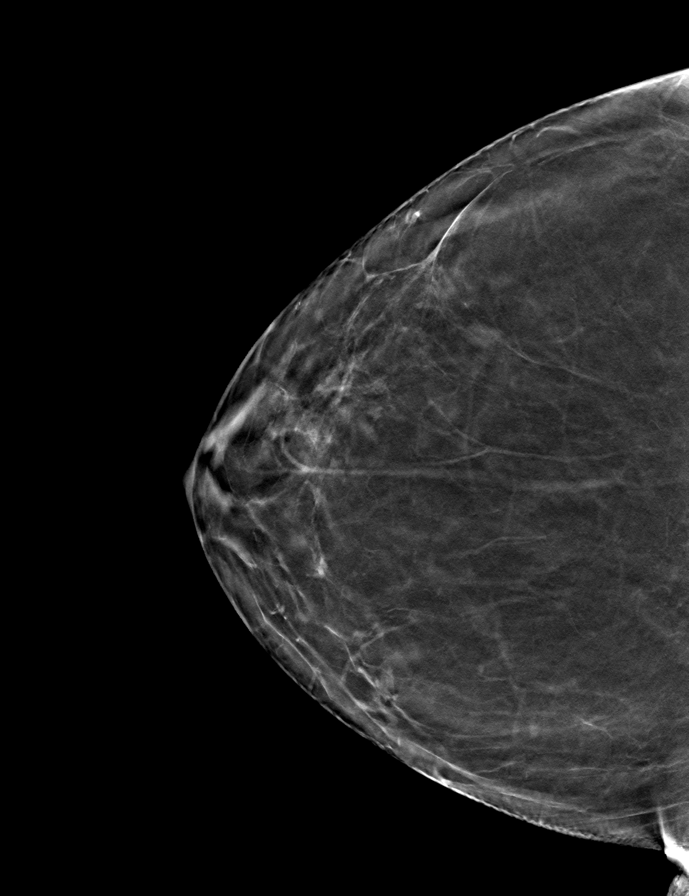

[R MLO tomo · tomo slice 33/64.0]
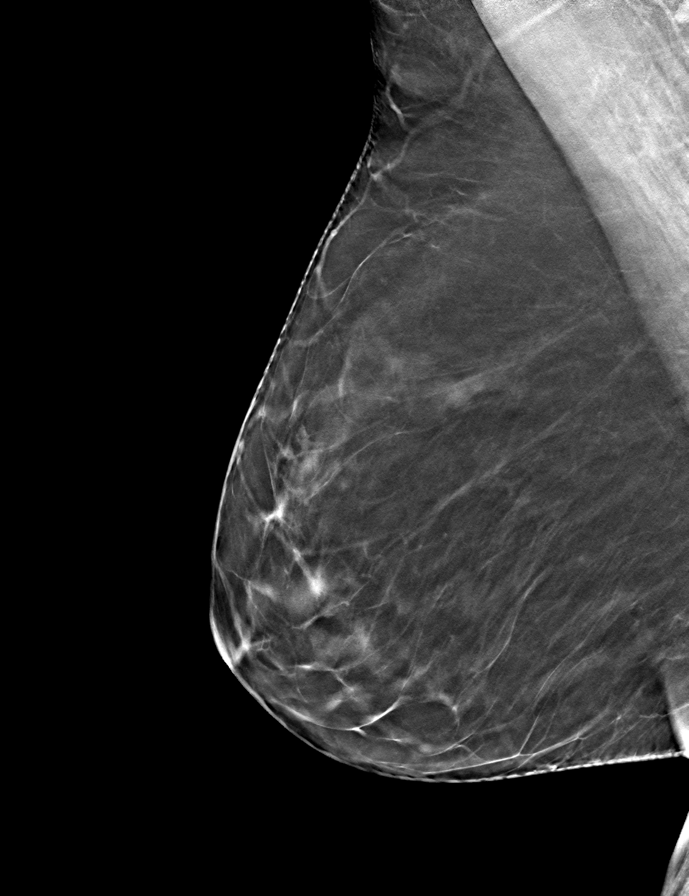

[9 of 24 positions shown; findings below may reference images not displayed]

ACR Breast Density Category b: There are scattered areas of
fibroglandular density.
FINDINGS: There are no findings suspicious for malignancy. Images were
processed with CAD.
IMPRESSION: No mammographic evidence of malignancy. A result letter of this
screening mammogram will be mailed directly to the patient.

RECOMMENDATION:
Screening mammogram in one year. (Code:CN-U-775)

BI-RADS CATEGORY  1: Negative.

## 2019-09-10 NOTE — Progress Notes (Signed)
09/10/2019     Endocrinology Consult Note    Subjective:    Patient ID: Sara Lindsey, female    DOB: 01-28-1956, PCP Assunta Found, MD.   Past Medical History:  Diagnosis Date  . Depression   . Elevated blood pressure reading without diagnosis of hypertension   . Hyperlipidemia   . Mild CAD    a. 11/2016 - cardiac catheterization today showing minor nonobstructive CAD with very mild stenosis at the LAD/diagonal bifurcation, otherwise normal coronary arteries. Normal LVEDP.  [place] Kitchen Obesity     Past Surgical History:  Procedure Laterality Date  . ABDOMINAL HYSTERECTOMY    . COLONOSCOPY N/A 05/15/2018   Procedure: COLONOSCOPY;  Surgeon: Malissa Hippo, MD;  Location: [facility];  Service: Endoscopy;  Laterality: N/A;  730  . LEFT HEART CATH AND CORONARY ANGIOGRAPHY N/A 12/04/2016   Procedure: LEFT HEART CATH AND CORONARY ANGIOGRAPHY;  Surgeon: Tonny Bollman, MD;  Location: [facility] INVASIVE CV LAB;  Service: Cardiovascular;  Laterality: N/A;    Social History   Socioeconomic History  . Marital status: Married    Spouse name: Not on file  . Number of children: Not on file  . Years of education: Not on file  . Highest education level: Not on file  Occupational History  . Not on file  Tobacco Use  . Smoking status: Never Smoker  . Smokeless tobacco: Never Used  Vaping Use  . Vaping Use: Never used  Substance and Sexual Activity  . Alcohol use: No  . Drug use: No  . Sexual activity: Yes    Birth control/protection: Surgical  Other Topics Concern  . Not on file  Social History Narrative  . Not on file   Social Determinants of Health   Financial Resource Strain:   . Difficulty of Paying Living Expenses:   Food Insecurity:   . Worried About Programme researcher, broadcasting/film/video in the Last Year:   . Barista in the Last Year:   Transportation Needs:   . Freight forwarder (Medical):   Mar[place] Kitchen Lack of Transportation (Non-Medical):   Physical Activity:   . Days of Exercise  per Week:   . Minutes of Exercise per Session:   Stress:   . Feeling of Stress :   Social Connections:   . Frequency of Communication with Friends and Family:   . Frequency of Social Gatherings with Friends and Family:   . Attends Religious Services:   . Active Member of Clubs or Organizations:   . Attends Banker Meetings:   Mar[place] Kitchen Marital Status:     Family History  Problem Relation Age of Onset  . Arrhythmia Father   . Heart attack Maternal Grandmother July 21, 2062       died    Outpatient Encounter Medications as of 09/10/2019  Medication Sig  . levocetirizine (XYZAL) 5 MG tablet Take 5 mg by mouth every evening.  [place] Kitchen acetaminophen (TYLENOL) 500 MG tablet Take 1,000 mg by mouth every 6 (six) hours as needed for moderate pain or headache.  [place] Kitchen aspirin 81 MG EC tablet Take 1 tablet (81 mg total) by mouth daily.  [place] Kitchen atorvastatin (LIPITOR) 20 MG tablet Take 20 mg by mouth at bedtime.  . citalopram (CELEXA) 20 MG tablet Take 20 mg by mouth at bedtime.  . conjugated estrogens (PREMARIN) vaginal cream Place 1 Applicatorful vaginally daily as needed (dryness).  . fluticasone (FLONASE) 50 MCG/ACT nasal spray Place 2 sprays into both nostrils daily.  Mar[place] Kitchen  Omega-3 Fatty Acids (OMEGA 3 PO) Take 1 capsule by mouth daily.  [place] Kitchen omeprazole (PRILOSEC) 20 MG capsule Take 1 capsule (20 mg total) by mouth daily.   No facility-administered encounter medications on file as of 09/10/2019.    ALLERGIES: Allergies  Allergen Reactions  . Bee Venom   . Azithromycin Rash  . Flexeril [Cyclobenzaprine] Palpitations and Other (See Comments)    Patient reports that it also caused dizziness    VACCINATION STATUS:  There is no immunization history on file for this patient.   HPI  Sara Lindsey is 64 y.o. female who presents today with a medical history as above. she is being seen in consultation for hyperthyroidism requested by Assunta Found, MD.  she has been dealing with symptoms of fluctuating energy level  with fatigue, weight loss, tremors, and sleep disturbance for several weeks. These symptoms are progressively worsening and troubling to her. her most recent thyroid labs revealed significantly suppressed TSH on September 05, 2019 at 0.06.  she denies dysphagia, choking, shortness of breath, + recent voice change.    she denies family history of thyroid dysfunction.  denies family hx of thyroid cancer. she denies personal history of goiter. she is not on any anti-thyroid medications nor on any thyroid hormone supplements. she  is willing to proceed with appropriate work up and therapy for thyrotoxicosis.                           Review of systems  Constitutional: + weight loss, + fatigue, + subjective hyperthermia Eyes: no blurry vision, - xerophthalmia ENT: no sore throat, no nodules palpated in throat, no dysphagia/odynophagia, nor hoarseness Cardiovascular: no Chest Pain, no Shortness of Breath, -  palpitations, no leg swelling Respiratory: no cough, no SOB Gastrointestinal: no Nausea, no Vomiting, no Diarhhea Musculoskeletal: no muscle/joint aches Skin: no rashes Neurological: +  tremors, no numbness, no tingling, no dizziness Psychiatric: no depression, +  anxiety   Objective:    BP 108/66   Pulse 65   Ht 5\' 3"  (1.6 m)   Wt 177 lb (80.3 kg)   BMI 31.35 kg/m   Wt Readings from Last 3 Encounters:  09/10/19 177 lb (80.3 kg)  05/15/18 175 lb (79.4 kg)  12/04/16 201 lb 8 oz (91.4 kg)                                                Physical exam  Constitutional: Body mass index is 31.35 kg/m., not in acute distress, +stable  state of mind Eyes: PERRLA, EOMI, - exophthalmos ENT: moist mucous membranes, -  thyromegaly, no cervical lymphadenopathy Cardiovascular: + normal precordial activity, -tachycardic,  no Murmur/Rubs/Gallops Respiratory:  adequate breathing efforts, no gross chest deformity, Clear to auscultation bilaterally Gastrointestinal: abdomen soft, Non -tender, No  distension, Bowel Sounds present Musculoskeletal: no gross deformities, strength intact in all four extremities Skin: moist, warm, no rashes Neurological: +  tremor with outstretched hands,  ++ Deep Tendon Reflexes  on both lower extremities.   CMP     Component Value Date/Time   NA 140 12/03/2016 0705   K 3.9 12/03/2016 0705   CL 109 12/03/2016 0705   CO2 25 12/03/2016 0705   GLUCOSE 96 12/03/2016 0705   BUN 10 12/03/2016 0705   CREATININE 0.84 12/03/2016 0705   CALCIUM  8.4 (L) 12/03/2016 0705   PROT 6.4 (L) 12/03/2016 0705   ALBUMIN 3.3 (L) 12/03/2016 0705   AST 26 12/03/2016 0705   ALT 20 12/03/2016 0705   ALKPHOS 67 12/03/2016 0705   BILITOT 0.7 12/03/2016 0705   GFRNONAA >60 12/03/2016 0705   GFRAA >60 12/03/2016 0705     CBC    Component Value Date/Time   WBC 8.6 12/04/2016 0153   RBC 4.62 12/04/2016 0153   HGB 13.6 12/04/2016 0153   HCT 42.1 12/04/2016 0153   PLT 288 12/04/2016 0153   MCV 91.1 12/04/2016 0153   MCH 29.4 12/04/2016 0153   MCHC 32.3 12/04/2016 0153   RDW 14.3 12/04/2016 0153     Diabetic Labs (most recent): Lab Results  Component Value Date   HGBA1C 5.4 12/03/2016    Lipid Panel     Component Value Date/Time   CHOL 259 (H) 12/03/2016 0219   TRIG 544 (H) 12/03/2016 0219   HDL 32 (L) 12/03/2016 0219   CHOLHDL 8.1 12/03/2016 0219   VLDL UNABLE TO CALCULATE IF TRIGLYCERIDE OVER 400 mg/dL 57/26/2035 5974   LDLCALC UNABLE TO CALCULATE IF TRIGLYCERIDE OVER 400 mg/dL 16/38/4536 4680     Lab Results  Component Value Date   TSH 0.06 (A) 09/05/2019   TSH 3.257 12/03/2016        Assessment & Plan:   1. Hyperthyroidism  she is being seen at a kind request of Assunta Found, MD. her history and most recent labs are reviewed, and she was examined clinically. Subjective and objective findings are consistent with thyrotoxicosis likely from primary hyperthyroidism. The potential risks of untreated thyrotoxicosis and the need for definitive  therapy have been discussed in detail with her, and she agrees to proceed with diagnostic workup and treatment plan.   I like to repeat full profile thyroid function tests today and confirmatory thyroid uptake and scan will be scheduled to be done as soon as possible.   Options of therapy are discussed with her.  We discussed the option of treating it with medications including methimazole or PTU which may have side effects including rash, transaminitis, and bone marrow suppression.  We  also discussed the option of definitive therapy with RAI ablation of the thyroid.   If  she is found to have primary hyperthyroidism from Graves' disease , toxic multinodular goiter or toxic nodular goiter the preferred modality of treatment would be I-131 thyroid ablation.    -Patient is made aware of the high likelihood of post ablative hypothyroidism with subsequent need for lifelong thyroid hormone replacement. sheunderstands this outcome  and she is  willing to proceed.  Although surgery is one other choice of treatment in some cases, in her case surgery is not a good fit for presentation with only mild goiter.    she will return in 10-15 days for treatment decision. -Her pulse rate is controlled at 65, I did not initiate beta-blockers for now.  -Patient is advised to maintain close follow up with Assunta Found, MD for primary care needs.   - Time spent with the patient: 60 minutes, of which >50% was spent in obtaining information about her symptoms, reviewing her previous labs, evaluations, and treatments, counseling her about her hyperthyroidism, and developing a plan to confirm the diagnosis and long term treatment as necessary. Please refer to " Patient Self Inventory" in the Media  tab for reviewed elements of pertinent patient history.  Sara Lindsey participated in the discussions, expressed understanding, and voiced  agreement with the above plans.  All questions were answered to her satisfaction. she is  encouraged to contact clinic should she have any questions or concerns prior to her return visit.   Follow up plan: Return in about 10 days (around 09/20/2019) for Labs Today- Non-Fasting Ok, F/U with Thyroid Uptake and Scan.   Thank you for involving me in the care of this pleasant patient, and I will continue to update you with her progress.  Glade Lloyd, MD [facility] Phone: [phone]  Fax: [phone]   09/10/2019, 5:55 PM  This note was partially dictated with voice recognition software. Similar sounding words can be transcribed inadequately or may not  be corrected upon review.

## 2019-09-11 ENCOUNTER — Ambulatory Visit: Payer: Self-pay | Admitting: "Endocrinology

## 2019-09-11 LAB — T3, FREE: T3, Free: 2.8 pg/mL (ref 2.3–4.2)

## 2019-09-11 LAB — THYROGLOBULIN ANTIBODY: Thyroglobulin Ab: <1 IU/mL (ref <=1)

## 2019-09-11 LAB — T4, FREE: Free T4: 1.2 ng/dL (ref 0.8–1.8)

## 2019-09-11 LAB — THYROID PEROXIDASE ANTIBODY: Thyroperoxidase Ab SerPl-aCnc: 1 IU/mL (ref <9)

## 2019-09-11 LAB — TSH: TSH: 0.11 mIU/L — ABNORMAL LOW (ref 0.40–4.50)

## 2019-09-22 ENCOUNTER — Other Ambulatory Visit: Payer: Self-pay

## 2019-09-22 ENCOUNTER — Encounter (HOSPITAL_COMMUNITY)
Admission: RE | Admit: 2019-09-22 | Discharge: 2019-09-22 | Disposition: A | Discharge status: Home / Self Care | Payer: BC Managed Care – PPO | Source: Ambulatory Visit | Attending: "Endocrinology | Admitting: "Endocrinology

## 2019-09-22 ENCOUNTER — Ambulatory Visit: Payer: BC Managed Care – PPO | Admitting: "Endocrinology

## 2019-09-22 DIAGNOSIS — E0590 Thyrotoxicosis, unspecified without thyrotoxic crisis or storm: Secondary | ICD-10-CM | POA: Diagnosis present

## 2019-09-22 IMAGING — NM NM THYROID IMAGING W/ UPTAKE MULTI (4&24 HR)
4 series · 4 of 4 positions shown · non-contrast
Comparison: None

CLINICAL DATA: Hyperthyroidism, nervousness, anxiousness, weight
loss, heat intolerance, irritability and mood venous, fatigue

EXAM:
THYROID SCAN AND UPTAKE - 4 AND 24 HOURS
TECHNIQUE: Following oral administration of R-KKP capsule, anterior planar
imaging was acquired at 24 hours. Thyroid uptake was calculated with
a thyroid probe at 4-6 hours and 24 hours.
RADIOPHARMACEUTICALS:  304 uCi R-KKP sodium iodide p.o.

[Series 1: ant w marker · 1.18mm/px · 1 of 1 slices shown]
[im 1/1]
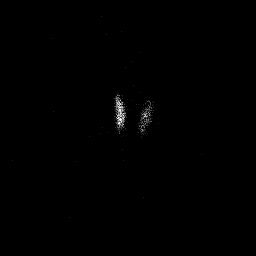

[Series 2: anterior · 1.18mm/px · 1 of 1 slices shown]
[im 1/1]
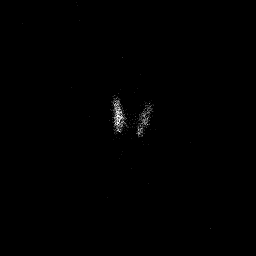

[Series 3: lao · 1.18mm/px · 1 of 1 slices shown]
[im 1/1]
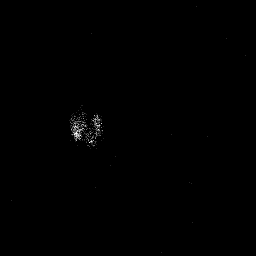

[Series 4: rao · 1.18mm/px · 1 of 1 slices shown]
[im 1/1]
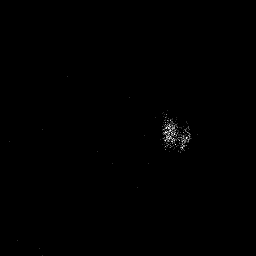

[4 of 4 positions shown; findings below may reference images not displayed]

FINDINGS: Homogeneous tracer distribution in both thyroid lobes.

No focal areas of increased or decreased tracer localization.

4 hour R-KKP uptake = 3.9% (normal 5-20%)

24 hour R-KKP uptake = 13.4% (normal 10-30%)
IMPRESSION: Normal exam.

## 2019-09-22 MED ORDER — SODIUM IODIDE I-123 7.4 MBQ CAPS
304.0000 | ORAL_CAPSULE | Freq: Once | ORAL | Status: AC
  Administered 2019-09-22: 304 via ORAL

## 2019-09-23 ENCOUNTER — Encounter (HOSPITAL_COMMUNITY)
Admission: RE | Admit: 2019-09-23 | Discharge: 2019-09-23 | Disposition: A | Discharge status: Home / Self Care | Payer: BC Managed Care – PPO | Source: Ambulatory Visit | Attending: "Endocrinology | Admitting: "Endocrinology

## 2019-10-10 ENCOUNTER — Ambulatory Visit (INDEPENDENT_AMBULATORY_CARE_PROVIDER_SITE_OTHER): Payer: BC Managed Care – PPO | Admitting: "Endocrinology

## 2019-10-10 ENCOUNTER — Other Ambulatory Visit: Payer: Self-pay

## 2019-10-10 ENCOUNTER — Encounter: Payer: Self-pay | Admitting: "Endocrinology

## 2019-10-10 VITALS — BP 113/72 | HR 59 | Ht 63.0 in | Wt 180.6 lb

## 2019-10-10 DIAGNOSIS — E0590 Thyrotoxicosis, unspecified without thyrotoxic crisis or storm: Secondary | ICD-10-CM | POA: Diagnosis not present

## 2019-10-10 MED ORDER — METHIMAZOLE 5 MG PO TABS: 5.0000 mg | ORAL_TABLET | Freq: Every day | ORAL | 3 refills | Status: DC

## 2019-10-10 NOTE — Progress Notes (Signed)
10/10/2019     Endocrinology follow-up note   Subjective:    Patient ID: Sara Lindsey, female    DOB: 1956/03/03, PCP Assunta Found, MD.   Past Medical History:  Diagnosis Date  . Depression   . Elevated blood pressure reading without diagnosis of hypertension   . Hyperlipidemia   . Mild CAD    a. 11/2016 - cardiac catheterization today showing minor nonobstructive CAD with very mild stenosis at the LAD/diagonal bifurcation, otherwise normal coronary arteries. Normal LVEDP.  Mar[place] Kitchen Obesity     Past Surgical History:  Procedure Laterality Date  . ABDOMINAL HYSTERECTOMY    . COLONOSCOPY N/A 05/15/2018   Procedure: COLONOSCOPY;  Surgeon: Malissa Hippo, MD;  Location: [facility];  Service: Endoscopy;  Laterality: N/A;  730  . LEFT HEART CATH AND CORONARY ANGIOGRAPHY N/A 12/04/2016   Procedure: LEFT HEART CATH AND CORONARY ANGIOGRAPHY;  Surgeon: Tonny Bollman, MD;  Location: [facility] INVASIVE CV LAB;  Service: Cardiovascular;  Laterality: N/A;    Social History   Socioeconomic History  . Marital status: Married    Spouse name: Not on file  . Number of children: Not on file  . Years of education: Not on file  . Highest education level: Not on file  Occupational History  . Not on file  Tobacco Use  . Smoking status: Never Smoker  . Smokeless tobacco: Never Used  Vaping Use  . Vaping Use: Never used  Substance and Sexual Activity  . Alcohol use: No  . Drug use: No  . Sexual activity: Yes    Birth control/protection: Surgical  Other Topics Concern  . Not on file  Social History Narrative  . Not on file   Social Determinants of Health   Financial Resource Strain:   . Difficulty of Paying Living Expenses:   Food Insecurity:   . Worried About Programme researcher, broadcasting/film/video in the Last Year:   . Barista in the Last Year:   Transportation Needs:   . Freight forwarder (Medical):   Mar[place] Kitchen Lack of Transportation (Non-Medical):   Physical Activity:   . Days of  Exercise per Week:   . Minutes of Exercise per Session:   Stress:   . Feeling of Stress :   Social Connections:   . Frequency of Communication with Friends and Family:   . Frequency of Social Gatherings with Friends and Family:   . Attends Religious Services:   . Active Member of Clubs or Organizations:   . Attends Banker Meetings:   Mar[place] Kitchen Marital Status:     Family History  Problem Relation Age of Onset  . Arrhythmia Father   . Heart attack Maternal Grandmother Aug 04, 2062       died    Outpatient Encounter Medications as of 10/10/2019  Medication Sig  . acetaminophen (TYLENOL) 500 MG tablet Take 1,000 mg by mouth every 6 (six) hours as needed for moderate pain or headache.  Mar[place] Kitchen aspirin 81 MG EC tablet Take 1 tablet (81 mg total) by mouth daily.  [place] Kitchen atorvastatin (LIPITOR) 20 MG tablet Take 20 mg by mouth at bedtime.  . citalopram (CELEXA) 20 MG tablet Take 20 mg by mouth at bedtime.  . conjugated estrogens (PREMARIN) vaginal cream Place 1 Applicatorful vaginally daily as needed (dryness).  . fluticasone (FLONASE) 50 MCG/ACT nasal spray Place 2 sprays into both nostrils daily.  Mar[place] Kitchen levocetirizine (XYZAL) 5 MG tablet Take 5 mg by mouth every evening.  Mar[place] Kitchen  methimazole (TAPAZOLE) 5 MG tablet Take 1 tablet (5 mg total) by mouth daily.  . Omega-3 Fatty Acids (OMEGA 3 PO) Take 1 capsule by mouth daily.  Mar[place] Kitchen omeprazole (PRILOSEC) 20 MG capsule Take 1 capsule (20 mg total) by mouth daily.   No facility-administered encounter medications on file as of 10/10/2019.    ALLERGIES: Allergies  Allergen Reactions  . Bee Venom   . Azithromycin Rash  . Flexeril [Cyclobenzaprine] Palpitations and Other (See Comments)    Patient reports that it also caused dizziness    VACCINATION STATUS:  There is no immunization history on file for this patient.   HPI  Sara Lindsey is 64 y.o. female who presents today with a medical history as above. she is being seen in follow-up after she was seen in  consultation for hyperthyroidism requested by Assunta Found, MD.  she has been dealing with symptoms of fluctuating energy level with fatigue, weight loss, tremors, and sleep disturbance for several weeks.  her subsequent labs showed suppressed TSH on September 05, 2019 at 0.06.  Her most recent thyroid function tests were still consistent with suppressed TSH, normal free T4 and free T3-see below.  Her uptake and scan was unremarkable.   she denies dysphagia, choking, shortness of breath, + recent voice change.    she denies family history of thyroid dysfunction.  denies family hx of thyroid cancer. she denies personal history of goiter. she is not on any anti-thyroid medications nor on any thyroid hormone supplements. she  is willing to proceed with appropriate work up and therapy for thyrotoxicosis.                           Review of systems  Constitutional: + weight loss, + fatigue, + subjective hyperthermia Eyes: no blurry vision, - xerophthalmia ENT: no sore throat, no nodules palpated in throat, no dysphagia/odynophagia, nor hoarseness Cardiovascular: no Chest Pain, no Shortness of Breath, -  palpitations, no leg swelling Respiratory: no cough, no SOB Gastrointestinal: no Nausea, no Vomiting, no Diarhhea Musculoskeletal: no muscle/joint aches Skin: no rashes Neurological: +  tremors, no numbness, no tingling, no dizziness Psychiatric: no depression, +  anxiety   Objective:    BP 113/72   Pulse (!) 59   Ht 5\' 3"  (1.6 m)   Wt 180 lb 9.6 oz (81.9 kg)   BMI 31.99 kg/m   Wt Readings from Last 3 Encounters:  10/10/19 180 lb 9.6 oz (81.9 kg)  09/10/19 177 lb (80.3 kg)  05/15/18 175 lb (79.4 kg)                                                Physical exam  Physical Exam- Limited  Constitutional:  Body mass index is 31.99 kg/m. , not in acute distress, normal state of mind Eyes:  EOMI, no exophthalmos Neck: Supple Thyroid: No gross goiter Respiratory: Adequate breathing  efforts Musculoskeletal: no gross deformities, strength intact in all four extremities, no gross restriction of joint movements Skin:  no rashes, no hyperemia Neurological: no tremor with outstretched hands,     CMP     Component Value Date/Time   NA 140 12/03/2016 0705   K 3.9 12/03/2016 0705   CL 109 12/03/2016 0705   CO2 25 12/03/2016 0705   GLUCOSE 96 12/03/2016 0705  BUN 10 12/03/2016 0705   CREATININE 0.84 12/03/2016 0705   CALCIUM 8.4 (L) 12/03/2016 0705   PROT 6.4 (L) 12/03/2016 0705   ALBUMIN 3.3 (L) 12/03/2016 0705   AST 26 12/03/2016 0705   ALT 20 12/03/2016 0705   ALKPHOS 67 12/03/2016 0705   BILITOT 0.7 12/03/2016 0705   GFRNONAA >60 12/03/2016 0705   GFRAA >60 12/03/2016 0705     CBC    Component Value Date/Time   WBC 8.6 12/04/2016 0153   RBC 4.62 12/04/2016 0153   HGB 13.6 12/04/2016 0153   HCT 42.1 12/04/2016 0153   PLT 288 12/04/2016 0153   MCV 91.1 12/04/2016 0153   MCH 29.4 12/04/2016 0153   MCHC 32.3 12/04/2016 0153   RDW 14.3 12/04/2016 0153     Diabetic Labs (most recent): Lab Results  Component Value Date   HGBA1C 5.4 12/03/2016    Lipid Panel     Component Value Date/Time   CHOL 259 (H) 12/03/2016 0219   TRIG 544 (H) 12/03/2016 0219   HDL 32 (L) 12/03/2016 0219   CHOLHDL 8.1 12/03/2016 0219   VLDL UNABLE TO CALCULATE IF TRIGLYCERIDE OVER 400 mg/dL 46/96/2952 8413   LDLCALC UNABLE TO CALCULATE IF TRIGLYCERIDE OVER 400 mg/dL 24/40/1027 2536     Lab Results  Component Value Date   TSH 0.11 (L) 09/10/2019   TSH 0.06 (A) 09/05/2019   TSH 3.257 12/03/2016   FREET4 1.2 09/10/2019      Thyroid uptake and scan on September 23, 2019: FINDINGS: Homogeneous tracer distribution in both thyroid lobes.  No focal areas of increased or decreased tracer localization.  4 hour I-123 uptake = 3.9% (normal 5-20%),  24 hour I-123 uptake = 13.4% (normal 10-30%)  IMPRESSION: Normal exam.   Assessment & Plan:   1.  Hyperthyroidism  -Her previsit work-up is consistent with subclinical hyperthyroidism.  Her uptake in 13.4% in 24 hours with no focal abnormality.  She would not need ablative treatment at this time.  However, she would benefit from low-dose methimazole intervention.  -I discussed and prescribed methimazole 5 mg p.o. daily with breakfast with plan to repeat thyroid function test in 3 months with office visit.   -Her pulse rate is controlled at 65, I did not initiate beta-blockers for now.  -Patient is advised to maintain close follow up with Assunta Found, MD for primary care needs.     - Time spent on this patient care encounter:  20 minutes of which 50% was spent in  counseling and the rest reviewing  her current and  previous labs / studies and medications  doses and developing a plan for long term care. Jelisa Tonasket Holloran  participated in the discussions, expressed understanding, and voiced agreement with the above plans.  All questions were answered to her satisfaction. she is encouraged to contact clinic should she have any questions or concerns prior to her return visit.   Follow up plan: Return in about 3 months (around 01/10/2020) for F/U with Pre-visit Labs.   Thank you for involving me in the care of this pleasant patient, and I will continue to update you with her progress.  Marquis Lunch, MD [facility] Phone: [phone]  Fax: [phone]   10/10/2019, 11:30 AM  This note was partially dictated with voice recognition software. Similar sounding words can be transcribed inadequately or may not  be corrected upon review.

## 2020-01-05 LAB — T4, FREE: Free T4: 1 ng/dL (ref 0.8–1.8)

## 2020-01-05 LAB — T3, FREE: T3, Free: 2.9 pg/mL (ref 2.3–4.2)

## 2020-01-05 LAB — TSH: TSH: 7.16 mIU/L — ABNORMAL HIGH (ref 0.40–4.50)

## 2020-01-08 ENCOUNTER — Other Ambulatory Visit: Payer: Self-pay | Admitting: "Endocrinology

## 2020-01-13 ENCOUNTER — Encounter: Payer: Self-pay | Admitting: "Endocrinology

## 2020-01-13 ENCOUNTER — Other Ambulatory Visit: Payer: Self-pay

## 2020-01-13 ENCOUNTER — Telehealth (INDEPENDENT_AMBULATORY_CARE_PROVIDER_SITE_OTHER): Payer: BC Managed Care – PPO | Admitting: "Endocrinology

## 2020-01-13 VITALS — Ht 64.0 in | Wt 174.0 lb

## 2020-01-13 DIAGNOSIS — E0590 Thyrotoxicosis, unspecified without thyrotoxic crisis or storm: Secondary | ICD-10-CM | POA: Diagnosis not present

## 2020-01-13 NOTE — Progress Notes (Signed)
01/13/2020                                    Endocrinology Telehealth Visit Follow up Note -During COVID -19 Pandemic  This visit type was conducted  via telephone due to national recommendations for restrictions regarding the COVID-19 Pandemic  in an effort to limit this patient's exposure and mitigate transmission of the corona virus.   I connected with Sara Lindsey on 01/13/2020   by telephone and verified that I am speaking with the correct person using two identifiers. Sara Lindsey, 1955-11-03. she has verbally consented to this visit.  I was in my office and patient was in her residence. No other persons were with me during the encounter. All issues noted in this document were discussed and addressed. The format was not optimal for physical exam.  Subjective:    Patient ID: Sara Lindsey, female    DOB: November 07, 1955, PCP Assunta Found, MD.   Past Medical History:  Diagnosis Date  . Depression   . Elevated blood pressure reading without diagnosis of hypertension   . Hyperlipidemia   . Mild CAD    a. 11/2016 - cardiac catheterization today showing minor nonobstructive CAD with very mild stenosis at the LAD/diagonal bifurcation, otherwise normal coronary arteries. Normal LVEDP.  Marland Kitchen Obesity     Past Surgical History:  Procedure Laterality Date  . ABDOMINAL HYSTERECTOMY    . COLONOSCOPY N/A 05/15/2018   Procedure: COLONOSCOPY;  Surgeon: Malissa Hippo, MD;  [facility];  Service: Endoscopy;  Laterality: N/A;  730  . LEFT HEART CATH AND CORONARY ANGIOGRAPHY N/A 12/04/2016   Procedure: LEFT HEART CATH AND CORONARY ANGIOGRAPHY;  Surgeon: Tonny Bollman, MD;  Location: [facility] INVASIVE CV LAB;  Service: Cardiovascular;  Laterality: N/A;    Social History   Socioeconomic History  . Marital status: Married    Spouse name: Not on file  . Number of children: Not on file  . Years of education: Not on file  . Highest education level: Not on file  Occupational History   . Not on file  Tobacco Use  . Smoking status: Never Smoker  . Smokeless tobacco: Never Used  Vaping Use  . Vaping Use: Never used  Substance and Sexual Activity  . Alcohol use: No  . Drug use: No  . Sexual activity: Yes    Birth control/protection: Surgical  Other Topics Concern  . Not on file  Social History Narrative  . Not on file   Social Determinants of Health   Financial Resource Strain:   . Difficulty of Paying Living Expenses: Not on file  Food Insecurity:   . Worried About Programme researcher, broadcasting/film/video in the Last Year: Not on file  . Ran Out of Food in the Last Year: Not on file  Transportation Needs:   . Lack of Transportation (Medical): Not on file  . Lack of Transportation (Non-Medical): Not on file  Physical Activity:   . Days of Exercise per Week: Not on file  . Minutes of Exercise per Session: Not on file  Stress:   . Feeling of Stress : Not on file  Social Connections:   . Frequency of Communication with Friends and Family: Not on file  . Frequency of Social Gatherings with Friends and Family: Not on file  . Attends Religious Services: Not on file  . Active Member of  Clubs or Organizations: Not on file  . Attends Banker Meetings: Not on file  . Marital Status: Not on file    Family History  Problem Relation Age of Onset  . Arrhythmia Father   . Heart attack Maternal Grandmother Jul 29, 2062       died    Outpatient Encounter Medications as of 01/13/2020  Medication Sig  . acetaminophen (TYLENOL) 500 MG tablet Take 1,000 mg by mouth every 6 (six) hours as needed for moderate pain or headache.  Marland Kitchen aspirin 81 MG EC tablet Take 1 tablet (81 mg total) by mouth daily.  Marland Kitchen atorvastatin (LIPITOR) 20 MG tablet Take 20 mg by mouth at bedtime.  . citalopram (CELEXA) 20 MG tablet Take 20 mg by mouth at bedtime.  . fluticasone (FLONASE) 50 MCG/ACT nasal spray Place 2 sprays into both nostrils daily.  Marland Kitchen levocetirizine (XYZAL) 5 MG tablet Take 5 mg by mouth every  evening.  . Omega-3 Fatty Acids (OMEGA 3 PO) Take 1 capsule by mouth daily.  Marland Kitchen omeprazole (PRILOSEC) 20 MG capsule Take 1 capsule (20 mg total) by mouth daily.  . [DISCONTINUED] conjugated estrogens (PREMARIN) vaginal cream Place 1 Applicatorful vaginally daily as needed (dryness).  . [DISCONTINUED] methimazole (TAPAZOLE) 5 MG tablet TAKE 1 TABLET BY MOUTH EVERY DAY   No facility-administered encounter medications on file as of 01/13/2020.    ALLERGIES: Allergies  Allergen Reactions  . Bee Venom   . Azithromycin Rash  . Flexeril [Cyclobenzaprine] Palpitations and Other (See Comments)    Patient reports that it also caused dizziness    VACCINATION STATUS:  There is no immunization history on file for this patient.   HPI  Sara Lindsey is 64 y.o. female who is being engaged in telehealth via telephone for follow-up of hyperthyroidism.    PMD: Assunta Found, MD.  she was initiated on low-dose methimazole during her last visit prior to which she has dealt with symptoms including weight loss, tremors, sleep disturbance.  She reports no new complaints today.  Her previsit labs are consistent with treatment effect with mild iatrogenic hypothyroidism.     she denies family history of thyroid dysfunction.  denies family hx of thyroid cancer. she denies personal history of goiter. she is not on any anti-thyroid medications nor on any thyroid hormone supplements. she  is willing to proceed with appropriate work up and therapy for thyrotoxicosis.   Review of systems: Limited as above.   Objective:    Ht 5\' 4"  (1.626 m)   Wt 174 lb (78.9 kg)   BMI 29.87 kg/m   Wt Readings from Last 3 Encounters:  01/13/20 174 lb (78.9 kg)  10/10/19 180 lb 9.6 oz (81.9 kg)  09/10/19 177 lb (80.3 kg)           CMP     Component Value Date/Time   NA 140 12/03/2016 0705   K 3.9 12/03/2016 0705   CL 109 12/03/2016 0705   CO2 25 12/03/2016 0705   GLUCOSE 96 12/03/2016 0705   BUN 10 12/03/2016  0705   CREATININE 0.84 12/03/2016 0705   CALCIUM 8.4 (L) 12/03/2016 0705   PROT 6.4 (L) 12/03/2016 0705   ALBUMIN 3.3 (L) 12/03/2016 0705   AST 26 12/03/2016 0705   ALT 20 12/03/2016 0705   ALKPHOS 67 12/03/2016 0705   BILITOT 0.7 12/03/2016 0705   GFRNONAA >60 12/03/2016 0705   GFRAA >60 12/03/2016 0705     CBC    Component Value Date/Time  WBC 8.6 12/04/2016 0153   RBC 4.62 12/04/2016 0153   HGB 13.6 12/04/2016 0153   HCT 42.1 12/04/2016 0153   PLT 288 12/04/2016 0153   MCV 91.1 12/04/2016 0153   MCH 29.4 12/04/2016 0153   MCHC 32.3 12/04/2016 0153   RDW 14.3 12/04/2016 0153     Diabetic Labs (most recent): Lab Results  Component Value Date   HGBA1C 5.4 12/03/2016    Lipid Panel     Component Value Date/Time   CHOL 259 (H) 12/03/2016 0219   TRIG 544 (H) 12/03/2016 0219   HDL 32 (L) 12/03/2016 0219   CHOLHDL 8.1 12/03/2016 0219   VLDL UNABLE TO CALCULATE IF TRIGLYCERIDE OVER 400 mg/dL 10/62/6948 5462   LDLCALC UNABLE TO CALCULATE IF TRIGLYCERIDE OVER 400 mg/dL 70/35/0093 8182     Lab Results  Component Value Date   TSH 7.16 (H) 01/05/2020   TSH 0.11 (L) 09/10/2019   TSH 0.06 (A) 09/05/2019   TSH 3.257 12/03/2016   FREET4 1.0 01/05/2020   FREET4 1.2 09/10/2019      Thyroid uptake and scan on September 23, 2019: FINDINGS: Homogeneous tracer distribution in both thyroid lobes.  No focal areas of increased or decreased tracer localization.  4 hour I-123 uptake = 3.9% (normal 5-20%),  24 hour I-123 uptake = 13.4% (normal 10-30%)  IMPRESSION: Normal exam.   Assessment & Plan:   1. Hyperthyroidism-resolved  -Her previsit work-up is consistent with treatment effect with methimazole.  Prior to her last visit, her thyroid uptake has been  13.4% in 24 hours with no focal abnormality.  She did not need ablative treatment .  She has responded adequately to methimazole.  At this time, she will be taken off of methimazole with plan to repeat thyroid  function test and office visit in 4 months.     -Patient is advised to maintain close follow up with Assunta Found, MD for primary care needs.    - Time spent on this patient care encounter:  20 minutes of which 50% was spent in  counseling and the rest reviewing  her current and  previous labs / studies and medications  doses and developing a plan for long term care. Sara Lindsey  participated in the discussions, expressed understanding, and voiced agreement with the above plans.  All questions were answered to her satisfaction. she is encouraged to contact clinic should she have any questions or concerns prior to her return visit.   Follow up plan: Return in about 4 months (around 05/15/2020) for F/U with Pre-visit Labs.   Thank you for involving me in the care of this pleasant patient, and I will continue to update you with her progress.  Marquis Lunch, MD [facility] Phone: [phone]  Fax: [phone]   01/13/2020, 4:12 PM  This note was partially dictated with voice recognition software. Similar sounding words can be transcribed inadequately or may not  be corrected upon review.

## 2020-04-14 ENCOUNTER — Ambulatory Visit: Payer: Self-pay

## 2020-04-14 ENCOUNTER — Encounter: Payer: Self-pay | Admitting: Orthopaedic Surgery

## 2020-04-14 ENCOUNTER — Ambulatory Visit (INDEPENDENT_AMBULATORY_CARE_PROVIDER_SITE_OTHER): Payer: BC Managed Care – PPO | Admitting: Orthopaedic Surgery

## 2020-04-14 VITALS — Ht 64.0 in | Wt 187.0 lb

## 2020-04-14 DIAGNOSIS — G8929 Other chronic pain: Secondary | ICD-10-CM | POA: Diagnosis not present

## 2020-04-14 DIAGNOSIS — M25561 Pain in right knee: Secondary | ICD-10-CM

## 2020-04-14 NOTE — Progress Notes (Signed)
Office Visit Note   Patient: Sara Lindsey           Date of Birth: [birth year]           MRN: [id number] Visit Date: 04/14/2020              Requested by: Assunta Found, MD [id number] [place],  [place] [address] PCP: Assunta Found, MD   Assessment & Plan: Visit Diagnoses:  1. Chronic pain of right knee   2. Acute pain of right knee     Plan: Acute onset of right knee pain after a fall on the ice yesterday. Seen at an urgent care facility in [facility]. X-rays were obtained. We have a disc with the x-rays but could not reproduce them on the computer. We do have a copy of the x-ray report that indicates no evidence of acute injury. Has developed subcutaneous hematoma along the proximal medial tibia. Her knee exam is actually benign. No effusion. No evidence of instability or joint tenderness. Patient has been on a baby aspirin for a long time and I suspect that is the reason why there is the hematoma. I'll treat this symptomatically with over-the-counter medicine. Continue with ice for a day or so and then heat. Does not need any ambulatory aid. No evidence of calf pain. Return as needed. I think this will slowly resolve without any further intervention Follow-Up Instructions: Return if symptoms worsen or fail to improve.   Orders:  No orders of the defined types were placed in this encounter.  No orders of the defined types were placed in this encounter.     Procedures: No procedures performed   Clinical Data: No additional findings.   Subjective: Chief Complaint  Patient presents with  . Right Knee - Pain  Patient presents today for right knee pain. She states that she fell while trying to get into her husband's truck yesterday. She has pain and swelling medially. She went to [facility] yesterday and had x-rays taken. She said that her knee hurts with any attempt at weightbearing, therefore she is using crutches to help ambulate. She feels better with  rest. She is taking Ibuprofen for pain relief. No previous knee surgery. She brought a disc with her x-rays on it today. X-ray report indicated no evidence of any acute injuries feeling better today with ability to bear weight. Does take a baby aspirin on a daily basis  HPI  Review of Systems   Objective: Vital Signs: Ht 5\' 4"  (1.626 m)   Wt 187 lb (84.8 kg)   BMI 32.10 kg/m   Physical Exam Constitutional:      Appearance: She is well-developed and well-nourished.  HENT:     Mouth/Throat:     Mouth: Oropharynx is clear and moist.  Eyes:     Extraocular Movements: EOM normal.     Pupils: Pupils are equal, round, and reactive to light.  Pulmonary:     Effort: Pulmonary effort is normal.  Skin:    General: Skin is warm and dry.  Neurological:     Mental Status: She is alert and oriented to person, place, and time.  Psychiatric:        Mood and Affect: Mood and affect normal.        Behavior: Behavior normal.     Ortho Exam awake alert and oriented x3. No acute distress. Right knee exam was benign without evidence of effusion or bruising. No opening with varus valgus stress. No  medial lateral joint pain. No pain beneath the patella. Full knee extension of flexed over 100 degrees. No popliteal pain. There was a large area of swelling along the proximal tibia medially with some early ecchymosis consistent with a hematoma. There is no calf pain. Compartment was soft. Neurologically intact.  Specialty Comments:  No specialty comments available.  Imaging: No results found.   PMFS History: Patient Active Problem List   Diagnosis Date Noted  . Pain in right knee 04/14/2020  . Subclinical hyperthyroidism 10/10/2019  . Abnormal thyroid blood test 09/10/2019  . History of colonic polyps 01/10/2018  . Obesity 12/04/2016  . Elevated blood pressure reading without diagnosis of hypertension 12/04/2016  . Mild CAD 12/04/2016  . Chest pain at rest   . Hyperlipidemia 12/02/2016    Past Medical History:  Diagnosis Date  . Depression   . Elevated blood pressure reading without diagnosis of hypertension   . Hyperlipidemia   . Mild CAD    a. 11/2016 - cardiac catheterization today showing minor nonobstructive CAD with very mild stenosis at the LAD/diagonal bifurcation, otherwise normal coronary arteries. Normal LVEDP.  Marland Kitchen Obesity     Family History  Problem Relation Age of Onset  . Arrhythmia Father   . Heart attack Maternal Grandmother 64       died    Past Surgical History:  Procedure Laterality Date  . ABDOMINAL HYSTERECTOMY    . COLONOSCOPY N/A 05/15/2018   Procedure: COLONOSCOPY;  Surgeon: Malissa Hippo, MD;  Location: [facility];  Service: Endoscopy;  Laterality: N/A;  730  . LEFT HEART CATH AND CORONARY ANGIOGRAPHY N/A 12/04/2016   Procedure: LEFT HEART CATH AND CORONARY ANGIOGRAPHY;  Surgeon: Tonny Bollman, MD;  Location: [facility] INVASIVE CV LAB;  Service: Cardiovascular;  Laterality: N/A;   Social History   Occupational History  . Not on file  Tobacco Use  . Smoking status: Never Smoker  . Smokeless tobacco: Never Used  Vaping Use  . Vaping Use: Never used  Substance and Sexual Activity  . Alcohol use: No  . Drug use: No  . Sexual activity: Yes    Birth control/protection: Surgical

## 2020-04-20 ENCOUNTER — Ambulatory Visit: Payer: BC Managed Care – PPO | Admitting: Orthopedic Surgery

## 2020-04-21 ENCOUNTER — Telehealth: Payer: Self-pay

## 2020-04-21 NOTE — Telephone Encounter (Signed)
Patient called she is requesting a note a note that states she has been seen in our office she also needs the diagnosis code to be included in the letter she stated the letter can be mailed or she can come pick it up. [phone]

## 2020-04-21 NOTE — Telephone Encounter (Signed)
Ok for note with diagnosis?

## 2020-04-21 NOTE — Telephone Encounter (Signed)
Ok to send

## 2020-04-21 NOTE — Telephone Encounter (Signed)
This would come from the clinic. Signing off as is already forwarded to L.G.

## 2020-04-21 NOTE — Telephone Encounter (Signed)
Note entered. I called patient and she would like to pick up note in [place] office. It is placed at front desk for her.

## 2020-05-05 ENCOUNTER — Ambulatory Visit (INDEPENDENT_AMBULATORY_CARE_PROVIDER_SITE_OTHER): Payer: BC Managed Care – PPO | Admitting: Orthopaedic Surgery

## 2020-05-05 ENCOUNTER — Other Ambulatory Visit: Payer: Self-pay

## 2020-05-05 ENCOUNTER — Encounter: Payer: Self-pay | Admitting: Orthopaedic Surgery

## 2020-05-05 DIAGNOSIS — S8010XA Contusion of unspecified lower leg, initial encounter: Secondary | ICD-10-CM | POA: Insufficient documentation

## 2020-05-05 DIAGNOSIS — S8011XA Contusion of right lower leg, initial encounter: Secondary | ICD-10-CM | POA: Diagnosis not present

## 2020-05-05 NOTE — Progress Notes (Signed)
Office Visit Note   Patient: Sara Lindsey           Date of Birth: 1955/04/17           MRN: [id number] Visit Date: 05/05/2020              Requested by: Assunta Found, MD [address] [place],  [place] [address] PCP: Assunta Found, MD   Assessment & Plan: Visit Diagnoses:  1. Hematoma of lower leg     Plan: Hematoma proximal anterior right tibia as a result of a fall on the ice about 3 weeks ago.  It appears benign.  Is large and is resolving but no evidence of infection no neurologic deficit and no evidence of any injury to the knee.  Just reassured that this should resolve over time.  Taking an aspirin every other day.  Patient will check with her primary care physician regarding need for the aspirin  Follow-Up Instructions: Return if symptoms worsen or fail to improve.   Orders:  No orders of the defined types were placed in this encounter.  No orders of the defined types were placed in this encounter.     Procedures: No procedures performed   Clinical Data: No additional findings.   Subjective: Chief Complaint  Patient presents with  . Right Knee - Follow-up  Patient presents today for a three week follow up on her right knee. She fell on 04/13/2020. She states that her bruising has improved. She still has the hematoma and states that it is still painful. She takes Advil as needed.   HPI  Review of Systems   Objective: Vital Signs: Ht 5\' 4"  (1.626 m)   Wt 187 lb (84.8 kg)   BMI 32.10 kg/m   Physical Exam Constitutional:      Appearance: She is well-developed and well-nourished.  HENT:     Mouth/Throat:     Mouth: Oropharynx is clear and moist.  Eyes:     Extraocular Movements: EOM normal.     Pupils: Pupils are equal, round, and reactive to light.  Pulmonary:     Effort: Pulmonary effort is normal.  Skin:    General: Skin is warm and dry.  Neurological:     Mental Status: She is alert and oriented to person, place, and time.  Psychiatric:         Mood and Affect: Mood and affect normal.        Behavior: Behavior normal.     Ortho Exam awake alert and oriented x3.  Comfortable sitting.  Hematoma is quite visible along the anterior proximal right tibia below the knee.  Knee exam is benign without evidence of pain or effusion.  Having ecchymosis distally in the leg.  No calf pain.  No ankle or foot swelling.  Neurologically intact.  Good pulses.  No evidence of infection of the hematoma.  Minimally tender.  Size is probably 5 cm in diameter not fluctuant Specialty Comments:  No specialty comments available.  Imaging: No results found.   PMFS History: Patient Active Problem List   Diagnosis Date Noted  . Hematoma of lower leg 05/05/2020  . Pain in right knee 04/14/2020  . Subclinical hyperthyroidism 10/10/2019  . Abnormal thyroid blood test 09/10/2019  . History of colonic polyps 01/10/2018  . Obesity 12/04/2016  . Elevated blood pressure reading without diagnosis of hypertension 12/04/2016  . Mild CAD 12/04/2016  . Chest pain at rest   . Hyperlipidemia 12/02/2016   Past Medical History:  Diagnosis Date  . Depression   . Elevated blood pressure reading without diagnosis of hypertension   . Hyperlipidemia   . Mild CAD    a. 11/2016 - cardiac catheterization today showing minor nonobstructive CAD with very mild stenosis at the LAD/diagonal bifurcation, otherwise normal coronary arteries. Normal LVEDP.  Marland Kitchen Obesity     Family History  Problem Relation Age of Onset  . Arrhythmia Father   . Heart attack Maternal Grandmother 64       died    Past Surgical History:  Procedure Laterality Date  . ABDOMINAL HYSTERECTOMY    . COLONOSCOPY N/A 05/15/2018   Procedure: COLONOSCOPY;  Surgeon: Malissa Hippo, MD;  [facility];  Service: Endoscopy;  Laterality: N/A;  730  . LEFT HEART CATH AND CORONARY ANGIOGRAPHY N/A 12/04/2016   Procedure: LEFT HEART CATH AND CORONARY ANGIOGRAPHY;  Surgeon: Tonny Bollman, MD;   Location: [facility] INVASIVE CV LAB;  Service: Cardiovascular;  Laterality: N/A;   Social History   Occupational History  . Not on file  Tobacco Use  . Smoking status: Never Smoker  . Smokeless tobacco: Never Used  Vaping Use  . Vaping Use: Never used  Substance and Sexual Activity  . Alcohol use: No  . Drug use: No  . Sexual activity: Yes    Birth control/protection: Surgical

## 2020-05-14 LAB — T3, FREE: T3, Free: 2.7 pg/mL (ref 2.0–4.4)

## 2020-05-14 LAB — T4, FREE: Free T4: 1.13 ng/dL (ref 0.82–1.77)

## 2020-05-14 LAB — TSH: TSH: 1.44 u[IU]/mL (ref 0.450–4.500)

## 2020-05-18 ENCOUNTER — Ambulatory Visit: Payer: BC Managed Care – PPO | Admitting: "Endocrinology

## 2020-05-21 ENCOUNTER — Encounter: Payer: Self-pay | Admitting: "Endocrinology

## 2020-05-21 ENCOUNTER — Other Ambulatory Visit: Payer: Self-pay

## 2020-05-21 ENCOUNTER — Ambulatory Visit (INDEPENDENT_AMBULATORY_CARE_PROVIDER_SITE_OTHER): Payer: BC Managed Care – PPO | Admitting: "Endocrinology

## 2020-05-21 VITALS — BP 116/72 | HR 64 | Ht 64.0 in | Wt 189.6 lb

## 2020-05-21 DIAGNOSIS — E0590 Thyrotoxicosis, unspecified without thyrotoxic crisis or storm: Secondary | ICD-10-CM | POA: Diagnosis not present

## 2020-05-21 NOTE — Progress Notes (Signed)
05/21/2020          Endocrinology follow-up note   Subjective:    Patient ID: Sara Lindsey, female    DOB: [birth year], PCP Assunta Found, MD.   Past Medical History:  Diagnosis Date  . Depression   . Elevated blood pressure reading without diagnosis of hypertension   . Hyperlipidemia   . Mild CAD    a. 11/2016 - cardiac catheterization today showing minor nonobstructive CAD with very mild stenosis at the LAD/diagonal bifurcation, otherwise normal coronary arteries. Normal LVEDP.  Marland Kitchen Obesity     Past Surgical History:  Procedure Laterality Date  . ABDOMINAL HYSTERECTOMY    . COLONOSCOPY N/A 05/15/2018   Procedure: COLONOSCOPY;  Surgeon: Malissa Hippo, MD;  [facility]: [facility];  Service: Endoscopy;  Laterality: N/A;  730  . LEFT HEART CATH AND CORONARY ANGIOGRAPHY N/A 12/04/2016   Procedure: LEFT HEART CATH AND CORONARY ANGIOGRAPHY;  Surgeon: Tonny Bollman, MD;  Location: [facility] INVASIVE CV LAB;  Service: Cardiovascular;  Laterality: N/A;    Social History   Socioeconomic History  . Marital status: Married    Spouse name: Not on file  . Number of children: Not on file  . Years of education: Not on file  . Highest education level: Not on file  Occupational History  . Not on file  Tobacco Use  . Smoking status: Never Smoker  . Smokeless tobacco: Never Used  Vaping Use  . Vaping Use: Never used  Substance and Sexual Activity  . Alcohol use: No  . Drug use: No  . Sexual activity: Yes    Birth control/protection: Surgical  Other Topics Concern  . Not on file  Social History Narrative  . Not on file   Social Determinants of Health   Financial Resource Strain: Not on file  Food Insecurity: Not on file  Transportation Needs: Not on file  Physical Activity: Not on file  Stress: Not on file  Social Connections: Not on file    Family History  Problem Relation Age of Onset  . Arrhythmia Father   . Heart attack Maternal Grandmother 07/30/2062       died     Outpatient Encounter Medications as of 05/21/2020  Medication Sig  . acetaminophen (TYLENOL) 500 MG tablet Take 1,000 mg by mouth every 6 (six) hours as needed for moderate pain or headache.  Marland Kitchen aspirin 81 MG EC tablet Take 1 tablet (81 mg total) by mouth daily. (Patient taking differently: Take 81 mg by mouth 2 (two) times a week.)  . atorvastatin (LIPITOR) 20 MG tablet Take 20 mg by mouth at bedtime.  . citalopram (CELEXA) 20 MG tablet Take 20 mg by mouth at bedtime.  . fluticasone (FLONASE) 50 MCG/ACT nasal spray Place 2 sprays into both nostrils daily.  Marland Kitchen levocetirizine (XYZAL) 5 MG tablet Take 5 mg by mouth every evening.  . Omega-3 Fatty Acids (OMEGA 3 PO) Take 1 capsule by mouth daily.  Marland Kitchen omeprazole (PRILOSEC) 20 MG capsule Take 1 capsule (20 mg total) by mouth daily.   No facility-administered encounter medications on file as of 05/21/2020.    ALLERGIES: Allergies  Allergen Reactions  . Bee Venom   . Azithromycin Rash  . Flexeril [Cyclobenzaprine] Palpitations and Other (See Comments)    Patient reports that it also caused dizziness    VACCINATION STATUS:  There is no immunization history on file for this patient.   HPI  Sara Lindsey is 65 y.o. female  who is being seen in follow-up in the management of hyperthyroidism.   PMD: Assunta Found, MD.   Prior to her last visit, she was treated with low-dose methimazole for mild primary hyperthyroidism. -She was taken off of methimazole due to presentation with iatrogenic hypothyroidism.   She did not need ablative treatment due to low uptake.  She has no new complaints today.  Her previsit thyroid function tests are consistent with euthyroid state. .she denies family history of thyroid dysfunction.  denies family hx of thyroid cancer. she denies personal history of goiter. she is not on any anti-thyroid medications nor on any thyroid hormone supplements.   Review of systems: Limited as above.   Objective:    BP 116/72    Pulse 64   Ht 5\' 4"  (1.626 m)   Wt 189 lb 9.6 oz (86 kg)   BMI 32.54 kg/m   Wt Readings from Last 3 Encounters:  05/21/20 189 lb 9.6 oz (86 kg)  05/05/20 187 lb (84.8 kg)  04/14/20 187 lb (84.8 kg)           CMP     Component Value Date/Time   NA 140 12/03/2016 0705   K 3.9 12/03/2016 0705   CL 109 12/03/2016 0705   CO2 25 12/03/2016 0705   GLUCOSE 96 12/03/2016 0705   BUN 10 12/03/2016 0705   CREATININE 0.84 12/03/2016 0705   CALCIUM 8.4 (L) 12/03/2016 0705   PROT 6.4 (L) 12/03/2016 0705   ALBUMIN 3.3 (L) 12/03/2016 0705   AST 26 12/03/2016 0705   ALT 20 12/03/2016 0705   ALKPHOS 67 12/03/2016 0705   BILITOT 0.7 12/03/2016 0705   GFRNONAA >60 12/03/2016 0705   GFRAA >60 12/03/2016 0705     CBC    Component Value Date/Time   WBC 8.6 12/04/2016 0153   RBC 4.62 12/04/2016 0153   HGB 13.6 12/04/2016 0153   HCT 42.1 12/04/2016 0153   PLT 288 12/04/2016 0153   MCV 91.1 12/04/2016 0153   MCH 29.4 12/04/2016 0153   MCHC 32.3 12/04/2016 0153   RDW 14.3 12/04/2016 0153     Diabetic Labs (most recent): Lab Results  Component Value Date   HGBA1C 5.4 12/03/2016    Lipid Panel     Component Value Date/Time   CHOL 259 (H) 12/03/2016 0219   TRIG 544 (H) 12/03/2016 0219   HDL 32 (L) 12/03/2016 0219   CHOLHDL 8.1 12/03/2016 0219   VLDL UNABLE TO CALCULATE IF TRIGLYCERIDE OVER 400 mg/dL 02/02/2017 16/12/9602   LDLCALC UNABLE TO CALCULATE IF TRIGLYCERIDE OVER 400 mg/dL 5409 81/19/1478     Lab Results  Component Value Date   TSH 1.440 05/13/2020   TSH 7.16 (H) 01/05/2020   TSH 0.11 (L) 09/10/2019   TSH 0.06 (A) 09/05/2019   TSH 3.257 12/03/2016   FREET4 1.13 05/13/2020   FREET4 1.0 01/05/2020   FREET4 1.2 09/10/2019      Thyroid uptake and scan on September 23, 2019: FINDINGS: Homogeneous tracer distribution in both thyroid lobes.  No focal areas of increased or decreased tracer localization.  4 hour I-123 uptake = 3.9% (normal 5-20%),  24 hour I-123  uptake = 13.4% (normal 10-30%)  IMPRESSION: Normal exam.   Assessment & Plan:   1. Hyperthyroidism-resolved  -Her previsit work-up is consistent with euthyroid state.  She will not need antithyroid intervention at this time.  Prior thyroid uptake has been  13.4% in 24 hours with no focal abnormality.  She did not need  ablative treatment .  She has responded adequately to methimazole.  At this time, she will be continued without intervention with plan to repeat thyroid function test and office visit in a year.   -Patient is advised to maintain close follow up with Assunta Found, MD for primary care needs.     - Time spent on this patient care encounter:  30 minutes of which 50% was spent in  counseling and the rest reviewing  her current and  previous labs / studies and medications  doses and developing a plan for long term care, and documenting this care. Amaryah Mallen Celia  participated in the discussions, expressed understanding, and voiced agreement with the above plans.  All questions were answered to her satisfaction. she is encouraged to contact clinic should she have any questions or concerns prior to her return visit.    Follow up plan: Return in about 1 year (around 05/21/2021) for F/U with Pre-visit Labs.   Thank you for involving me in the care of this pleasant patient, and I will continue to update you with her progress.  Marquis Lunch, MD [facility] Phone: [phone]  Fax: [phone]   05/21/2020, 11:03 AM  This note was partially dictated with voice recognition software. Similar sounding words can be transcribed inadequately or may not  be corrected upon review.

## 2020-07-28 ENCOUNTER — Other Ambulatory Visit (HOSPITAL_COMMUNITY): Payer: Self-pay | Admitting: Obstetrics and Gynecology

## 2020-07-28 DIAGNOSIS — Z1231 Encounter for screening mammogram for malignant neoplasm of breast: Secondary | ICD-10-CM

## 2020-09-20 ENCOUNTER — Ambulatory Visit (HOSPITAL_COMMUNITY)
Admission: RE | Admit: 2020-09-20 | Discharge: 2020-09-20 | Disposition: A | Discharge status: Home / Self Care | Payer: BC Managed Care – PPO | Source: Ambulatory Visit | Attending: Obstetrics and Gynecology | Admitting: Obstetrics and Gynecology

## 2020-09-20 ENCOUNTER — Other Ambulatory Visit: Payer: Self-pay

## 2020-09-20 DIAGNOSIS — Z1231 Encounter for screening mammogram for malignant neoplasm of breast: Secondary | ICD-10-CM | POA: Diagnosis present

## 2020-09-20 IMAGING — MG MM DIGITAL SCREENING BILAT W/ TOMO AND CAD
8 series · 8 of 24 positions shown · non-contrast
Comparison: Previous exam(s).

CLINICAL DATA: Screening.

EXAM:
DIGITAL SCREENING BILATERAL MAMMOGRAM WITH TOMOSYNTHESIS AND CAD
TECHNIQUE: Bilateral screening digital craniocaudal and mediolateral oblique
mammograms were obtained. Bilateral screening digital breast
tomosynthesis was performed. The images were evaluated with
computer-aided detection.

[L CC synth-2D]
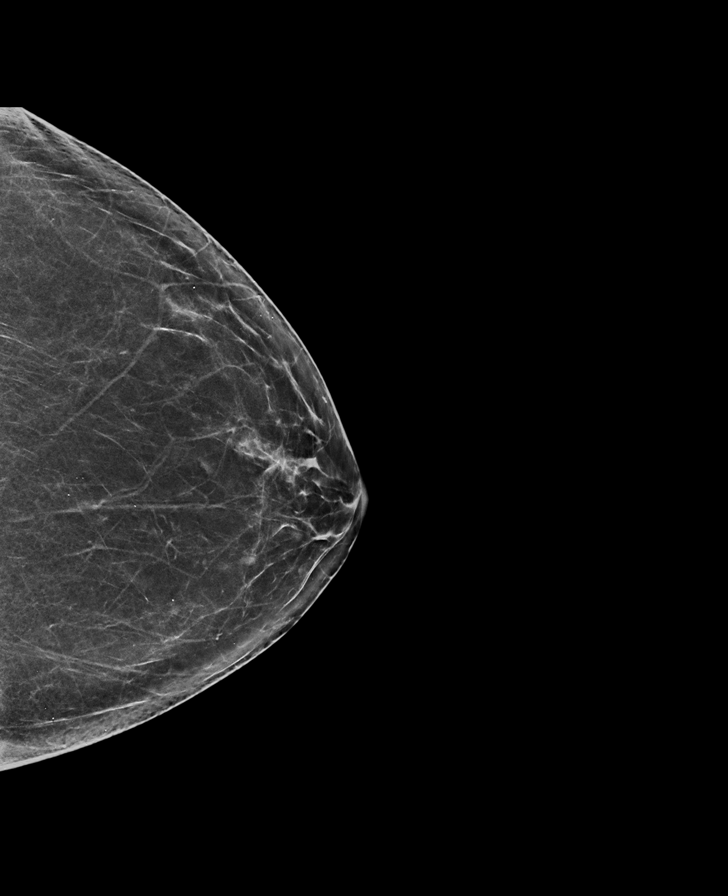

[R CC synth-2D]
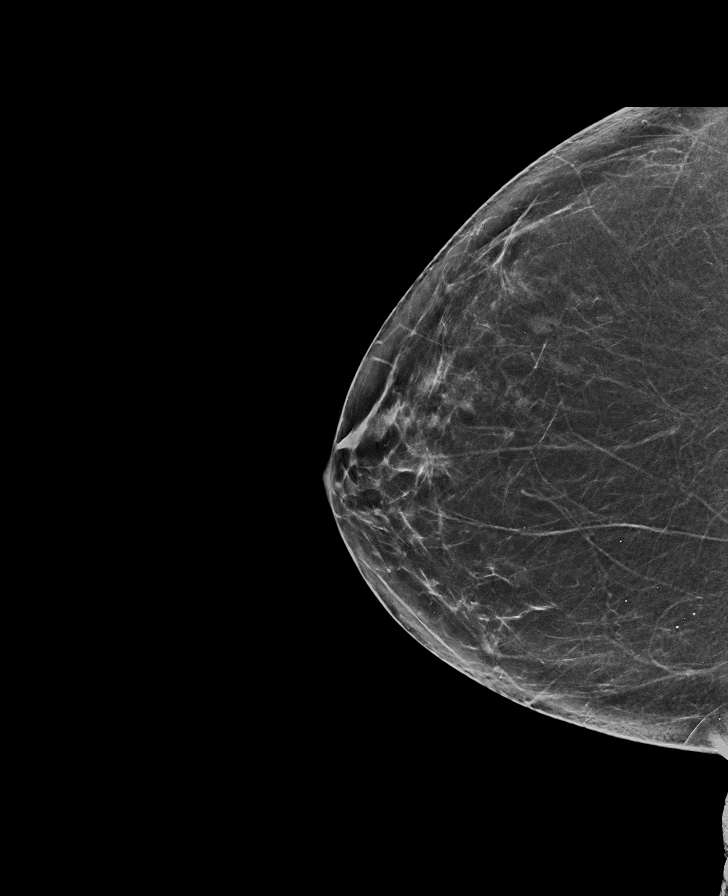

[R MLO synth-2D]
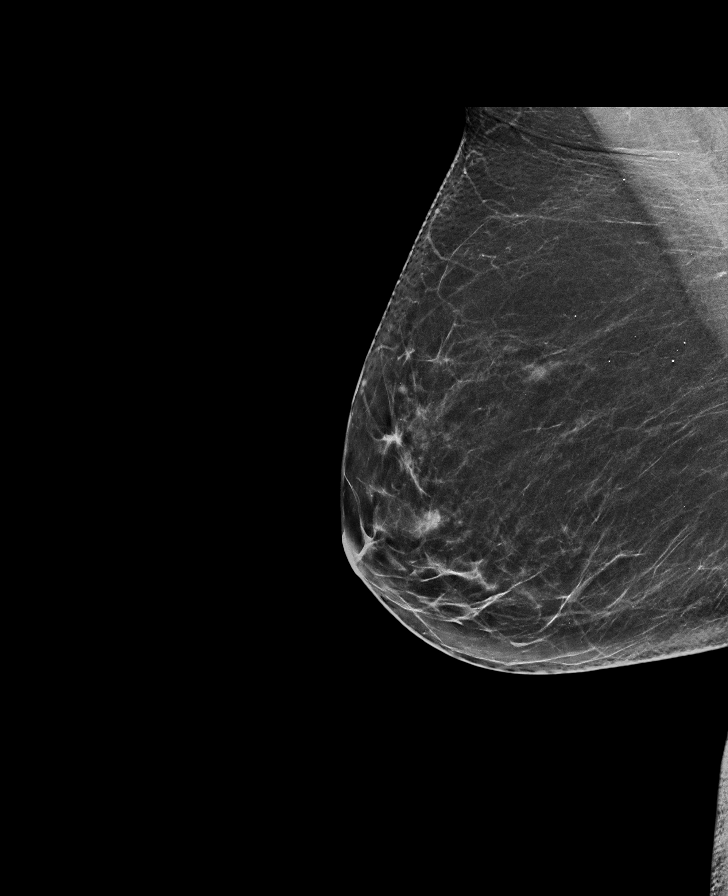

[L MLO synth-2D]
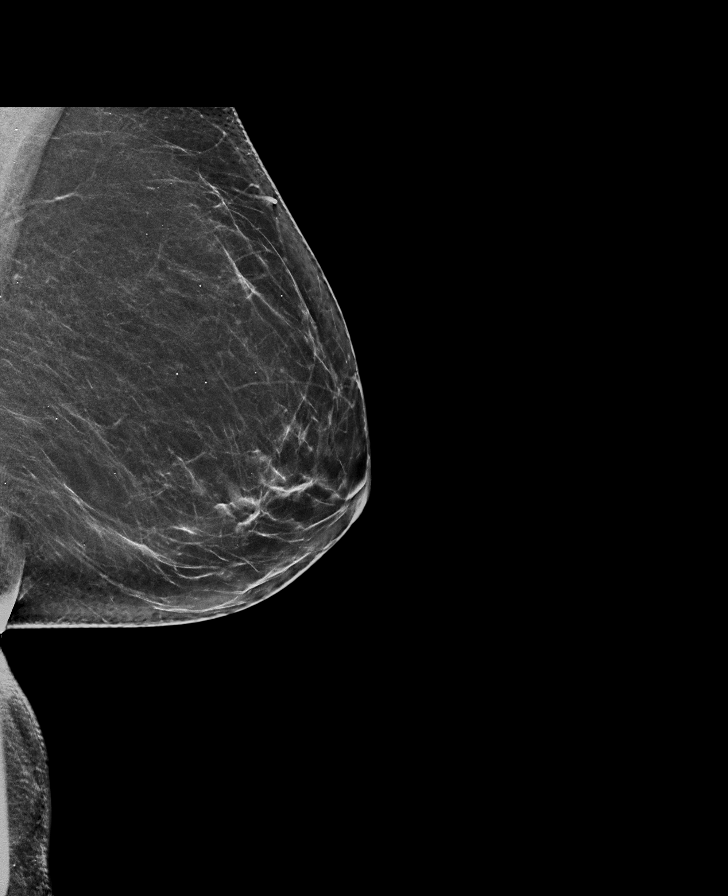

[R MLO tomo · tomo slice 35/69.0]
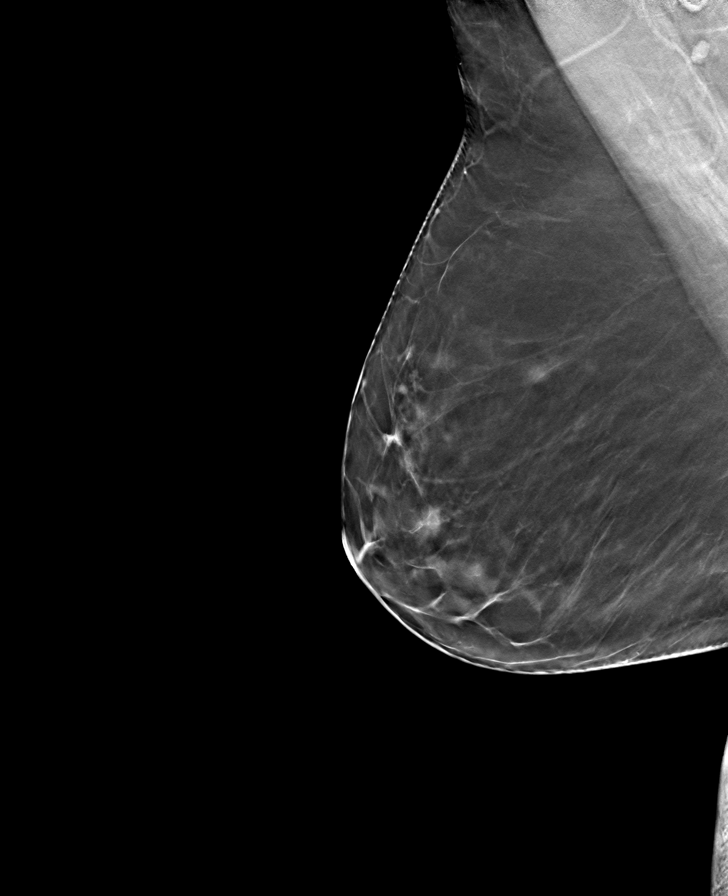

[R CC tomo · tomo slice 33/65.0]
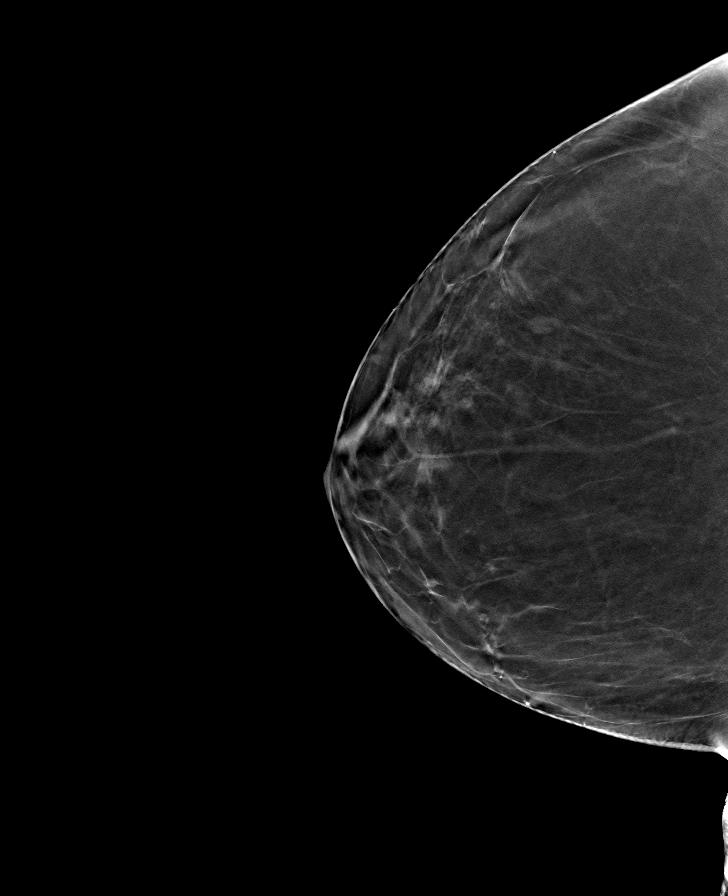

[L MLO tomo · tomo slice 35/70.0]
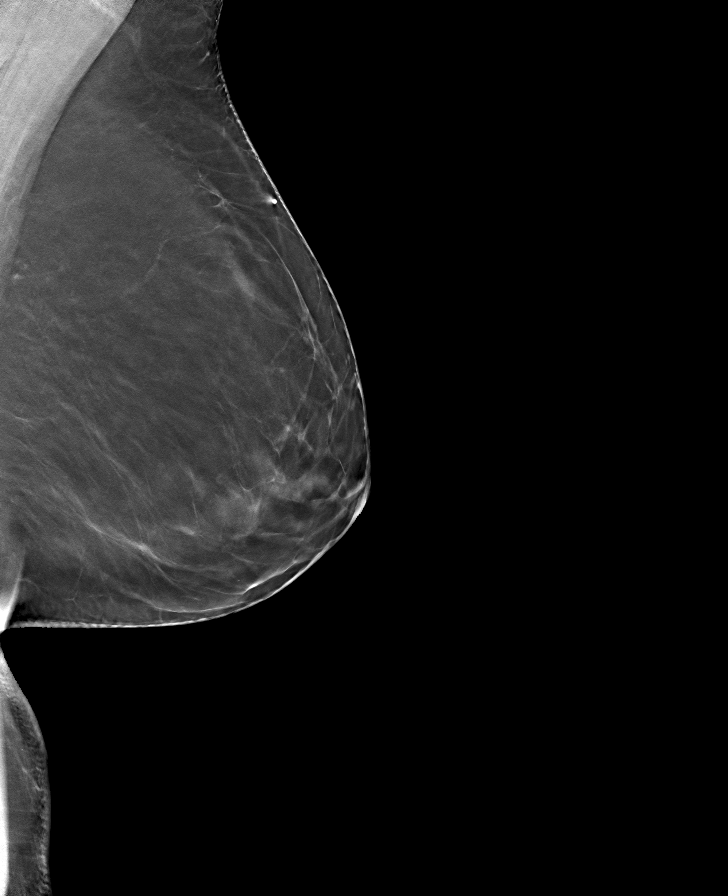

[L CC tomo · tomo slice 33/64.0]
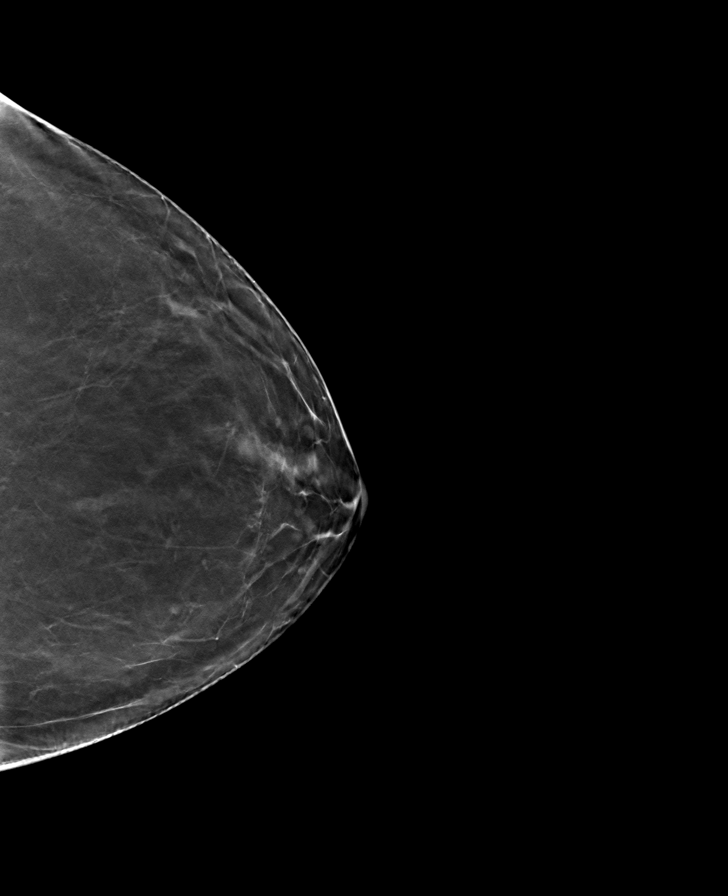

[8 of 24 positions shown; findings below may reference images not displayed]

ACR Breast Density Category b: There are scattered areas of
fibroglandular density.
FINDINGS: There are no findings suspicious for malignancy.
IMPRESSION: No mammographic evidence of malignancy. A result letter of this
screening mammogram will be mailed directly to the patient.

RECOMMENDATION:
Screening mammogram in one year. (Code:51-O-LD2)

BI-RADS CATEGORY  1: Negative.

## 2021-01-05 ENCOUNTER — Ambulatory Visit: Payer: Medicare PPO | Admitting: Orthopaedic Surgery

## 2021-01-05 ENCOUNTER — Encounter: Payer: Self-pay | Admitting: Orthopaedic Surgery

## 2021-01-05 ENCOUNTER — Ambulatory Visit: Payer: Self-pay

## 2021-01-05 DIAGNOSIS — G8929 Other chronic pain: Secondary | ICD-10-CM

## 2021-01-05 DIAGNOSIS — M25562 Pain in left knee: Secondary | ICD-10-CM

## 2021-01-05 MED ORDER — METHYLPREDNISOLONE ACETATE 40 MG/ML IJ SUSP
80.0000 mg | INTRAMUSCULAR | Status: AC | PRN
  Administered 2021-01-05: 80 mg via INTRA_ARTICULAR

## 2021-01-05 MED ORDER — LIDOCAINE HCL 1 % IJ SOLN
2.0000 mL | INTRAMUSCULAR | Status: AC | PRN
Start: 2021-01-05 — End: 2021-01-05
  Administered 2021-01-05: 2 mL

## 2021-01-05 NOTE — Progress Notes (Signed)
Office Visit Note   Patient: Sara Lindsey           Date of Birth: [birth year]           MRN: [id number] Visit Date: 01/05/2021              Requested by: Assunta Found, MD [address] [place],  [place] [address] PCP: Assunta Found, MD   Assessment & Plan: Visit Diagnoses:  1. Chronic pain of left knee     Plan: Findings consistent with osteoarthritis of the left knee more specifically the patellofemoral joint.  Natural history of this was explained to the patient.  We will go forward with a injection today.  Patient may follow-up as needed.  Emphasized the importance of staying fit and maintaining leg strength through low impact exercises such as walking and swimming  Follow-Up Instructions: No follow-ups on file.   Orders:  Orders Placed This Encounter  Procedures   XR KNEE 3 VIEW LEFT   No orders of the defined types were placed in this encounter.     Procedures: Large Joint Inj: L knee on 01/05/2021 1:24 PM Indications: pain and diagnostic evaluation Details: 25 G 1.5 in needle, superior approach  Arthrogram: No  Medications: 80 mg methylPREDNISolone acetate 40 MG/ML; 2 mL lidocaine 1 % Outcome: tolerated well, no immediate complications Procedure, treatment alternatives, risks and benefits explained, specific risks discussed. Consent was given by the patient. Immediately prior to procedure a time out was called to verify the correct patient, procedure, equipment, support staff and site/side marked as required. Patient was prepped and draped in the usual sterile fashion.    After verbal consent the patient was injected with 2 mL of 1% lidocaine 2 mL of Marcaine and 2 mL of methyl prednisolone 40 mg/mL.  Patient tolerated this well  Clinical Data: No additional findings.   Subjective: Chief Complaint  Patient presents with   Left Knee - Pain  Patient presents today for left knee pain. She said that it has been hurting for 4 months. No know injury. Her pain  is located anteriorly. She said that over time it is worsening. She has been experiencing giving way. She said that it hurts all the time, but worse with weightbearing. She has tried Voltaren gel and Advil, but they do not seem to help much.    Review of Systems  All other systems reviewed and are negative.   Objective: Vital Signs: There were no vitals taken for this visit.  Physical Exam Constitutional:      Appearance: Normal appearance.  HENT:     Head: Normocephalic.  Pulmonary:     Effort: Pulmonary effort is normal.  Neurological:     General: No focal deficit present.     Mental Status: She is alert.  Psychiatric:        Mood and Affect: Mood normal.    Ortho Exam Examination of her left knee.  She has no effusion.  No erythema.  She has slight valgus clinical alignment.  She has no tenderness along the medial or lateral joint line.  She is tender with full extension over the lateral patellar border also reproduced with flexion' positive patellar grinding with range of motion she does good varus and valgus stability she has a good endpoint on anterior draw.  She has 5 out of 5 strength with active range of motion. Specialty Comments:  No specialty comments available.  Imaging: No results found.   PMFS History: Patient Active  Problem List   Diagnosis Date Noted   Hematoma of lower leg 05/05/2020   Pain in right knee 04/14/2020   Subclinical hyperthyroidism 10/10/2019   Abnormal thyroid blood test 09/10/2019   History of colonic polyps 01/10/2018   Obesity 12/04/2016   Elevated blood pressure reading without diagnosis of hypertension 12/04/2016   Mild CAD 12/04/2016   Chest pain at rest    Hyperlipidemia 12/02/2016   Past Medical History:  Diagnosis Date   Depression    Elevated blood pressure reading without diagnosis of hypertension    Hyperlipidemia    Mild CAD    a. 11/2016 - cardiac catheterization today showing minor nonobstructive CAD with very mild  stenosis at the LAD/diagonal bifurcation, otherwise normal coronary arteries. Normal LVEDP.   Obesity     Family History  Problem Relation Age of Onset   Arrhythmia Father    Heart attack Maternal Grandmother 44       died    Past Surgical History:  Procedure Laterality Date   ABDOMINAL HYSTERECTOMY     COLONOSCOPY N/A 05/15/2018   Procedure: COLONOSCOPY;  Surgeon: Malissa Hippo, MD;  [facility]: [facility];  Service: Endoscopy;  Laterality: N/A;  730   LEFT HEART CATH AND CORONARY ANGIOGRAPHY N/A 12/04/2016   Procedure: LEFT HEART CATH AND CORONARY ANGIOGRAPHY;  Surgeon: Tonny Bollman, MD;  Location: [facility] INVASIVE CV LAB;  Service: Cardiovascular;  Laterality: N/A;   Social History   Occupational History   Not on file  Tobacco Use   Smoking status: Never   Smokeless tobacco: Never  Vaping Use   Vaping Use: Never used  Substance and Sexual Activity   Alcohol use: No   Drug use: No   Sexual activity: Yes    Birth control/protection: Surgical

## 2021-04-07 DIAGNOSIS — M722 Plantar fascial fibromatosis: Secondary | ICD-10-CM | POA: Diagnosis not present

## 2021-04-27 ENCOUNTER — Telehealth: Payer: Self-pay | Admitting: "Endocrinology

## 2021-04-27 DIAGNOSIS — E0590 Thyrotoxicosis, unspecified without thyrotoxic crisis or storm: Secondary | ICD-10-CM

## 2021-04-27 NOTE — Telephone Encounter (Signed)
Can you update lab orders pt for [facility]?

## 2021-04-27 NOTE — Telephone Encounter (Signed)
Lab orders updated and sent to [facility]. 

## 2021-05-16 DIAGNOSIS — E0590 Thyrotoxicosis, unspecified without thyrotoxic crisis or storm: Secondary | ICD-10-CM | POA: Diagnosis not present

## 2021-05-17 LAB — T3, FREE: T3, Free: 2.7 pg/mL (ref 2.0–4.4)

## 2021-05-17 LAB — T4, FREE: Free T4: 0.89 ng/dL (ref 0.82–1.77)

## 2021-05-17 LAB — TSH: TSH: 2.26 u[IU]/mL (ref 0.450–4.500)

## 2021-05-23 ENCOUNTER — Ambulatory Visit: Payer: BC Managed Care – PPO | Admitting: "Endocrinology

## 2021-06-07 ENCOUNTER — Encounter: Payer: Self-pay | Admitting: "Endocrinology

## 2021-06-07 ENCOUNTER — Other Ambulatory Visit: Payer: Self-pay

## 2021-06-07 ENCOUNTER — Ambulatory Visit: Payer: BC Managed Care – PPO | Admitting: "Endocrinology

## 2021-06-07 ENCOUNTER — Ambulatory Visit: Payer: Medicare PPO | Admitting: "Endocrinology

## 2021-06-07 VITALS — BP 114/72 | HR 64 | Ht 64.5 in | Wt 194.0 lb

## 2021-06-07 DIAGNOSIS — E6609 Other obesity due to excess calories: Secondary | ICD-10-CM

## 2021-06-07 DIAGNOSIS — Z6832 Body mass index (BMI) 32.0-32.9, adult: Secondary | ICD-10-CM | POA: Diagnosis not present

## 2021-06-07 DIAGNOSIS — E0590 Thyrotoxicosis, unspecified without thyrotoxic crisis or storm: Secondary | ICD-10-CM

## 2021-06-07 DIAGNOSIS — E782 Mixed hyperlipidemia: Secondary | ICD-10-CM | POA: Diagnosis not present

## 2021-06-07 NOTE — Patient Instructions (Signed)

## 2021-06-07 NOTE — Progress Notes (Signed)
? ? ? 06/07/2021   ? ?  ? ?Endocrinology follow-up note ? ? ?Subjective:  ? ? Patient ID: Sara Lindsey, female    DOB: 1956/01/17, PCP Assunta Found, MD. ? ? ?Past Medical History:  ?Diagnosis Date  ? Depression   ? Elevated blood pressure reading without diagnosis of hypertension   ? Hyperlipidemia   ? Mild CAD   ? a. 11/2016 - cardiac catheterization today showing minor nonobstructive CAD with very mild stenosis at the LAD/diagonal bifurcation, otherwise normal coronary arteries. Normal LVEDP.  ? Obesity   ? ? ?Past Surgical History:  ?Procedure Laterality Date  ? ABDOMINAL HYSTERECTOMY    ? COLONOSCOPY N/A 05/15/2018  ? Procedure: COLONOSCOPY;  Surgeon: Malissa Hippo, MD;  [facility];  Service: Endoscopy;  Laterality: N/A;  730  ? LEFT HEART CATH AND CORONARY ANGIOGRAPHY N/A 12/04/2016  ? Procedure: LEFT HEART CATH AND CORONARY ANGIOGRAPHY;  Surgeon: Tonny Bollman, MD;  Location: [facility] INVASIVE CV LAB;  Service: Cardiovascular;  Laterality: N/A;  ? ? ?Social History  ? ?Socioeconomic History  ? Marital status: Married  ?  Spouse name: Not on file  ? Number of children: Not on file  ? Years of education: Not on file  ? Highest education level: Not on file  ?Occupational History  ? Not on file  ?Tobacco Use  ? Smoking status: Never  ? Smokeless tobacco: Never  ?Vaping Use  ? Vaping Use: Never used  ?Substance and Sexual Activity  ? Alcohol use: No  ? Drug use: No  ? Sexual activity: Yes  ?  Birth control/protection: Surgical  ?Other Topics Concern  ? Not on file  ?Social History Narrative  ? Not on file  ? ?Social Determinants of Health  ? ?Financial Resource Strain: Not on file  ?Food Insecurity: Not on file  ?Transportation Needs: Not on file  ?Physical Activity: Not on file  ?Stress: Not on file  ?Social Connections: Not on file  ? ? ?Family History  ?Problem Relation Age of Onset  ? Arrhythmia Father   ? Heart attack Maternal Grandmother 38  ?     died  ? ? ?Outpatient Encounter Medications as  of 06/07/2021  ?Medication Sig  ? acetaminophen (TYLENOL) 500 MG tablet Take 1,000 mg by mouth every 6 (six) hours as needed for moderate pain or headache.  ? aspirin 81 MG EC tablet Take 1 tablet (81 mg total) by mouth daily. (Patient taking differently: Take 81 mg by mouth 2 (two) times a week.)  ? atorvastatin (LIPITOR) 20 MG tablet Take 20 mg by mouth at bedtime.  ? citalopram (CELEXA) 20 MG tablet Take 20 mg by mouth at bedtime.  ? fluticasone (FLONASE) 50 MCG/ACT nasal spray Place 2 sprays into both nostrils daily.  ? levocetirizine (XYZAL) 5 MG tablet Take 5 mg by mouth every evening.  ? Omega-3 Fatty Acids (OMEGA 3 PO) Take 1 capsule by mouth daily.  ? omeprazole (PRILOSEC) 20 MG capsule Take 1 capsule (20 mg total) by mouth daily.  ? ?No facility-administered encounter medications on file as of 06/07/2021.  ? ? ?ALLERGIES: ?Allergies  ?Allergen Reactions  ? Bee Venom   ? Azithromycin Rash  ? Flexeril [Cyclobenzaprine] Palpitations and Other (See Comments)  ?  Patient reports that it also caused dizziness  ? ? ?VACCINATION STATUS: ? ?There is no immunization history on file for this patient. ? ? ?HPI ? ?Sara Lindsey is 66 y.o. female who is being seen in  follow-up in the management of hyperthyroidism.   ?PMD: Assunta FoundGolding, John, MD.  ? ?Prior to her last visit, she was treated with low-dose methimazole for mild primary hyperthyroidism. ?-She was taken off of methimazole due to presentation with euthyroid state.  She is not currently on antithyroid intervention.  She has no new complaints except weight gain and inability to lose weight.  ?She also has history of severe dyslipidemia, currently on low-dose Lipitor. ?.she denies family history of thyroid dysfunction.  denies family hx of thyroid cancer. she denies personal history of goiter. she is not on any anti-thyroid medications nor on any thyroid hormone supplements. ? ? ?Review of systems: Limited as above. ? ? ?Objective:  ?  ?BP 114/72   Pulse 64   Ht 5'  4.5" (1.638 m)   Wt 194 lb (88 kg)   BMI 32.79 kg/m?   ?Wt Readings from Last 3 Encounters:  ?06/07/21 194 lb (88 kg)  ?05/21/20 189 lb 9.6 oz (86 kg)  ?05/05/20 187 lb (84.8 kg)  ?  ?    ? ? ? ?CMP  ?   ?Component Value Date/Time  ? NA 140 12/03/2016 0705  ? K 3.9 12/03/2016 0705  ? CL 109 12/03/2016 0705  ? CO2 25 12/03/2016 0705  ? GLUCOSE 96 12/03/2016 0705  ? BUN 10 12/03/2016 0705  ? CREATININE 0.84 12/03/2016 0705  ? CALCIUM 8.4 (L) 12/03/2016 0705  ? PROT 6.4 (L) 12/03/2016 0705  ? ALBUMIN 3.3 (L) 12/03/2016 0705  ? AST 26 12/03/2016 0705  ? ALT 20 12/03/2016 0705  ? ALKPHOS 67 12/03/2016 0705  ? BILITOT 0.7 12/03/2016 0705  ? GFRNONAA >60 12/03/2016 0705  ? GFRAA >60 12/03/2016 0705  ? ? ? ?CBC ?   ?Component Value Date/Time  ? WBC 8.6 12/04/2016 0153  ? RBC 4.62 12/04/2016 0153  ? HGB 13.6 12/04/2016 0153  ? HCT 42.1 12/04/2016 0153  ? PLT 288 12/04/2016 0153  ? MCV 91.1 12/04/2016 0153  ? MCH 29.4 12/04/2016 0153  ? MCHC 32.3 12/04/2016 0153  ? RDW 14.3 12/04/2016 0153  ? ? ? ?Diabetic Labs (most recent): ?Lab Results  ?Component Value Date  ? HGBA1C 5.4 12/03/2016  ? ? ?Lipid Panel  ?   ?Component Value Date/Time  ? CHOL 259 (H) 12/03/2016 0219  ? TRIG 544 (H) 12/03/2016 0219  ? HDL 32 (L) 12/03/2016 0219  ? CHOLHDL 8.1 12/03/2016 0219  ? VLDL UNABLE TO CALCULATE IF TRIGLYCERIDE OVER 400 mg/dL 16/10/960409/11/2016 54090219  ? LDLCALC UNABLE TO CALCULATE IF TRIGLYCERIDE OVER 400 mg/dL 81/19/147809/11/2016 29560219  ? ? ? ?Lab Results  ?Component Value Date  ? TSH 2.260 05/16/2021  ? TSH 1.440 05/13/2020  ? TSH 7.16 (H) 01/05/2020  ? TSH 0.11 (L) 09/10/2019  ? TSH 0.06 (A) 09/05/2019  ? TSH 3.257 12/03/2016  ? FREET4 0.89 05/16/2021  ? FREET4 1.13 05/13/2020  ? FREET4 1.0 01/05/2020  ? FREET4 1.2 09/10/2019  ?  ? ? ?Thyroid uptake and scan on September 23, 2019: ?FINDINGS: ?Homogeneous tracer distribution in both thyroid lobes. ?  ?No focal areas of increased or decreased tracer localization. ?  ?4 hour I-123 uptake = 3.9% (normal  5-20%),  24 hour I-123 uptake = 13.4% (normal 10-30%) ?  ?IMPRESSION: ?Normal exam. ? ? ?Assessment & Plan:  ? ?1. Hyperthyroidism-resolved ? ?-Her previsit work-up is consistent with euthyroid state.  She will not need antithyroid intervention nor thyroid hormone supplement at this time.  Her previous thyroid uptake and  scan was 13.4% in 24 hours with no focal abnormality.  He did not require ablative treatment.  She will need thyroid function test measured 1-2 times per year.   ? ?2.  Regarding her metabolic syndrome evident from obesity, dyslipidemia, patient will benefit from lifestyle medicine. ? ?- she acknowledges that there is a room for improvement in her food and drink choices. ?- Suggestion is made for her to avoid simple carbohydrates  from her diet including Cakes, Sweet Desserts, Ice Cream, Soda (diet and regular), Sweet Tea, Candies, Chips, Cookies, Store Bought Juices, Alcohol , Artificial Sweeteners,  Coffee Creamer, and "Sugar-free" Products, Lemonade. This will help patient to have more stable blood glucose profile and potentially avoid unintended weight gain. ? ?The following Lifestyle Medicine recommendations according to American College of Lifestyle Medicine  [facility]) were discussed and and offered to patient and she  agrees to start the journey:  ?A. Whole Foods, Plant-Based Nutrition comprising of fruits and vegetables, plant-based proteins, whole-grain carbohydrates was discussed in detail with the patient.   A list for source of those nutrients were also provided to the patient.  Patient will use only water or unsweetened tea for hydration. ?B.  The need to stay away from risky substances including alcohol, smoking; obtaining 7 to 9 hours of restorative sleep, at least 150 minutes of moderate intensity exercise weekly, the importance of healthy social connections,  and stress management techniques were discussed. ?C.  A full color Sipos of  Calorie density of various food groups per pound  showing examples of each food groups was provided to the patient. ? ? ?-Patient is advised to maintain close follow up with Assunta Found, MD for primary care needs. ? ? ?I spent 31 minutes in the care of the patie

## 2021-06-14 ENCOUNTER — Telehealth: Payer: Self-pay | Admitting: "Endocrinology

## 2021-06-14 NOTE — Telephone Encounter (Signed)
Patient called and canceled her Sept appt. She said that she will have Dr Phillips Odor check her Lipid and CMP. She said will call and sch something in a year to be seen with Dr Fransico Him for her thyroid ?

## 2021-07-13 ENCOUNTER — Encounter: Payer: Self-pay | Admitting: Orthopaedic Surgery

## 2021-07-13 ENCOUNTER — Ambulatory Visit: Payer: Medicare PPO | Admitting: Orthopaedic Surgery

## 2021-07-13 DIAGNOSIS — G8929 Other chronic pain: Secondary | ICD-10-CM

## 2021-07-13 DIAGNOSIS — M25562 Pain in left knee: Secondary | ICD-10-CM | POA: Diagnosis not present

## 2021-07-13 MED ORDER — LIDOCAINE HCL 1 % IJ SOLN
5.0000 mL | INTRAMUSCULAR | Status: AC | PRN
Start: 2021-07-13 — End: 2021-07-13
  Administered 2021-07-13: 5 mL

## 2021-07-13 MED ORDER — METHYLPREDNISOLONE ACETATE 40 MG/ML IJ SUSP
80.0000 mg | INTRAMUSCULAR | Status: AC | PRN
  Administered 2021-07-13: 80 mg via INTRA_ARTICULAR

## 2021-07-13 NOTE — Progress Notes (Signed)
? ?Office Visit Note ?  ?Patient: Sara Lindsey           ?Date of Birth: [birth year]           ?MRN: [id number] ?Visit Date: 07/13/2021 ?             ?Requested by: Assunta Found, MD ?[address] ?[place],  [place] [address] ?PCP: Assunta Found, MD ? ? ?Assessment & Plan: ?Visit Diagnoses: Left knee pain ? ?Plan: Patient comes in today requesting an injection into her left knee she has had a cortisone injection in the past and it helped significantly.  It has been more than 3 months since her last injection ? ?Follow-Up Instructions: No follow-ups on file.  ? ?Orders:  ?No orders of the defined types were placed in this encounter. ? ?No orders of the defined types were placed in this encounter. ? ? ? ? Procedures: ?Large Joint Inj: L knee on 07/13/2021 2:16 PM ?Indications: pain and diagnostic evaluation ?Details: 25 G 1.5 in needle, anteromedial approach ? ?Arthrogram: No ? ?Medications: 80 mg methylPREDNISolone acetate 40 MG/ML; 5 mL lidocaine 1 % ?Outcome: tolerated well, no immediate complications ?Procedure, treatment alternatives, risks and benefits explained, specific risks discussed. Consent was given by the patient.  ? ? ? ? ?Clinical Data: ?No additional findings. ? ? ?Subjective: ?Chief Complaint  ?Patient presents with  ? Left Knee - Pain  ?Patient presents today for recurrent left knee pain. She received a cortisone injection in October of last year. She is wanting to have that knee injected again today.  ? ? ? ?Review of Systems  ?All other systems reviewed and are negative. ? ? ?Objective: ?Vital Signs: There were no vitals taken for this visit. ? ?Physical Exam ?Patient appears well sitting comfortably in exam room ?Ortho Exam ?Left knee no redness no effusion she has medial lateral joint line pain and crepitation with range of motion ?Specialty Comments:  ?No specialty comments available. ? ?Imaging: ?No results found. ? ? ?PMFS History: ?Patient Active Problem List  ? Diagnosis Date Noted  ?  Hematoma of lower leg 05/05/2020  ? Pain in right knee 04/14/2020  ? Subclinical hyperthyroidism 10/10/2019  ? Abnormal thyroid blood test 09/10/2019  ? History of colonic polyps 01/10/2018  ? Class 1 obesity due to excess calories with serious comorbidity and body mass index (BMI) of 32.0 to 32.9 in adult 12/04/2016  ? Elevated blood pressure reading without diagnosis of hypertension 12/04/2016  ? Mild CAD 12/04/2016  ? Chest pain at rest   ? Mixed hyperlipidemia 12/02/2016  ? ?Past Medical History:  ?Diagnosis Date  ? Depression   ? Elevated blood pressure reading without diagnosis of hypertension   ? Hyperlipidemia   ? Mild CAD   ? a. 11/2016 - cardiac catheterization today showing minor nonobstructive CAD with very mild stenosis at the LAD/diagonal bifurcation, otherwise normal coronary arteries. Normal LVEDP.  ? Obesity   ?  ?Family History  ?Problem Relation Age of Onset  ? Arrhythmia Father   ? Heart attack Maternal Grandmother 14  ?     died  ?  ?Past Surgical History:  ?Procedure Laterality Date  ? ABDOMINAL HYSTERECTOMY    ? COLONOSCOPY N/A 05/15/2018  ? Procedure: COLONOSCOPY;  Surgeon: Malissa Hippo, MD;  [facility];  Service: Endoscopy;  Laterality: N/A;  730  ? LEFT HEART CATH AND CORONARY ANGIOGRAPHY N/A 12/04/2016  ? Procedure: LEFT HEART CATH AND CORONARY ANGIOGRAPHY;  Surgeon: Tonny Bollman, MD;  Location: [facility];  Service: Cardiovascular;  Laterality: N/A;  ? ?Social History  ? ?Occupational History  ? Not on file  ?Tobacco Use  ? Smoking status: Never  ? Smokeless tobacco: Never  ?Vaping Use  ? Vaping Use: Never used  ?Substance and Sexual Activity  ? Alcohol use: No  ? Drug use: No  ? Sexual activity: Yes  ?  Birth control/protection: Surgical  ? ? ? ? ? ? ?

## 2021-08-19 ENCOUNTER — Other Ambulatory Visit (HOSPITAL_COMMUNITY): Payer: Self-pay | Admitting: Obstetrics and Gynecology

## 2021-08-19 DIAGNOSIS — Z1231 Encounter for screening mammogram for malignant neoplasm of breast: Secondary | ICD-10-CM

## 2021-09-28 ENCOUNTER — Ambulatory Visit (HOSPITAL_COMMUNITY)
Admission: RE | Admit: 2021-09-28 | Discharge: 2021-09-28 | Disposition: A | Discharge status: Home / Self Care | Payer: Medicare PPO | Source: Ambulatory Visit | Attending: Obstetrics and Gynecology | Admitting: Obstetrics and Gynecology

## 2021-09-28 DIAGNOSIS — Z1231 Encounter for screening mammogram for malignant neoplasm of breast: Secondary | ICD-10-CM | POA: Insufficient documentation

## 2021-10-12 DIAGNOSIS — B379 Candidiasis, unspecified: Secondary | ICD-10-CM | POA: Diagnosis not present

## 2021-12-06 ENCOUNTER — Ambulatory Visit: Payer: Medicare PPO | Admitting: "Endocrinology

## 2021-12-08 ENCOUNTER — Ambulatory Visit: Payer: Medicare PPO | Admitting: "Endocrinology

## 2022-01-06 DIAGNOSIS — E782 Mixed hyperlipidemia: Secondary | ICD-10-CM | POA: Diagnosis not present

## 2022-01-06 DIAGNOSIS — Z0001 Encounter for general adult medical examination with abnormal findings: Secondary | ICD-10-CM | POA: Diagnosis not present

## 2022-01-06 DIAGNOSIS — E6609 Other obesity due to excess calories: Secondary | ICD-10-CM | POA: Diagnosis not present

## 2022-01-06 DIAGNOSIS — Z6830 Body mass index (BMI) 30.0-30.9, adult: Secondary | ICD-10-CM | POA: Diagnosis not present

## 2022-01-06 DIAGNOSIS — Z1331 Encounter for screening for depression: Secondary | ICD-10-CM | POA: Diagnosis not present

## 2022-01-06 DIAGNOSIS — Z23 Encounter for immunization: Secondary | ICD-10-CM | POA: Diagnosis not present

## 2022-01-06 DIAGNOSIS — E0590 Thyrotoxicosis, unspecified without thyrotoxic crisis or storm: Secondary | ICD-10-CM | POA: Diagnosis not present

## 2022-01-19 ENCOUNTER — Other Ambulatory Visit: Payer: Self-pay

## 2022-01-19 ENCOUNTER — Ambulatory Visit
Admission: EM | Admit: 2022-01-19 | Discharge: 2022-01-19 | Disposition: A | Discharge status: Home / Self Care | Payer: Medicare PPO | Attending: Nurse Practitioner | Admitting: Nurse Practitioner

## 2022-01-19 ENCOUNTER — Encounter: Payer: Self-pay | Admitting: Emergency Medicine

## 2022-01-19 DIAGNOSIS — J069 Acute upper respiratory infection, unspecified: Secondary | ICD-10-CM | POA: Diagnosis not present

## 2022-01-19 DIAGNOSIS — Z1152 Encounter for screening for COVID-19: Secondary | ICD-10-CM | POA: Insufficient documentation

## 2022-01-19 LAB — RESP PANEL BY RT-PCR (FLU A&B, COVID) ARPGX2
Influenza A by PCR: NEGATIVE
Influenza B by PCR: NEGATIVE
SARS Coronavirus 2 by RT PCR: NEGATIVE

## 2022-01-19 MED ORDER — BENZONATATE 100 MG PO CAPS: 100.0000 mg | ORAL_CAPSULE | Freq: Three times a day (TID) | ORAL | 0 refills | Status: AC | PRN

## 2022-01-19 NOTE — Discharge Instructions (Addendum)
You have a viral upper respiratory infection.  This should improve over the next 10 days or so.  If you develop chest pain or shortness of breath, go to the emergency room.    We have tested you today for COVID-19 and influenza.  If you test positive for either of these, you would be a good candidate for Paxlovid and Tamiflu..  You will see the results in Mychart and we will call you with positive results.    Please stay home and isolate until you are aware of the results.    Some things that can make you feel better are: - Increased rest - Increasing fluid with water/sugar free electrolytes - Acetaminophen and ibuprofen as needed for fever/pain  - Salt water gargling, chloraseptic spray and throat lozenges - OTC guaifenesin (Mucinex) 600 mg twice daily - Saline sinus flushes or a neti pot - Humidifying the air - Tessalon perles as needed for dry cough

## 2022-01-19 NOTE — ED Provider Notes (Signed)
[facility]    CSN: [id number] Arrival date & time: 01/19/22  1305      History   Chief Complaint No chief complaint on file.   HPI Sara Lindsey is a 66 y.o. female.   Patient presents for 1 day of body aches, low-grade fever, dry cough, nasal congestion, postnasal drainage, sore throat, headache, nausea from drainage going to her stomach, decreased appetite, fatigue, and altered taste/smell.  She denies shortness of breath, chest pain, chest congestion, runny nose, tooth or ear pain, abdominal pain, vomiting, diarrhea, and new rash.  Reports she was keeping her grandchildren earlier this week, however they were not sick.  Reports that she has seasonal allergies and takes Xyzal and Flonase year-round for them.  Has also taken Mucinex and throat lozenges for her symptoms.  Patient denies history of chronic lung disease.  He does have a history of hyperlipidemia, mild CAD, allergies.     Past Medical History:  Diagnosis Date   Depression    Elevated blood pressure reading without diagnosis of hypertension    Hyperlipidemia    Mild CAD    a. 11/2016 - cardiac catheterization today showing minor nonobstructive CAD with very mild stenosis at the LAD/diagonal bifurcation, otherwise normal coronary arteries. Normal LVEDP.   Obesity     Patient Active Problem List   Diagnosis Date Noted   Pain in left knee 07/13/2021   Hematoma of lower leg 05/05/2020   Pain in right knee 04/14/2020   Subclinical hyperthyroidism 10/10/2019   Abnormal thyroid blood test 09/10/2019   History of colonic polyps 01/10/2018   Class 1 obesity due to excess calories with serious comorbidity and body mass index (BMI) of 32.0 to 32.9 in adult 12/04/2016   Elevated blood pressure reading without diagnosis of hypertension 12/04/2016   Mild CAD 12/04/2016   Chest pain at rest    Mixed hyperlipidemia 12/02/2016    Past Surgical History:  Procedure Laterality Date   ABDOMINAL HYSTERECTOMY      COLONOSCOPY N/A 05/15/2018   Procedure: COLONOSCOPY;  Surgeon: Rogene Houston, MD;  [facility];  Service: Endoscopy;  Laterality: N/A;  730   LEFT HEART CATH AND CORONARY ANGIOGRAPHY N/A 12/04/2016   Procedure: LEFT HEART CATH AND CORONARY ANGIOGRAPHY;  Surgeon: Sherren Mocha, MD;  Location: [facility];  Service: Cardiovascular;  Laterality: N/A;    OB History   No obstetric history on file.      Home Medications    Prior to Admission medications   Medication Sig Start Date End Date Taking? Authorizing Provider  benzonatate (TESSALON) 100 MG capsule Take 1 capsule (100 mg total) by mouth 3 (three) times daily as needed for cough. Do not take with alcohol or while driving or operating heavy machinery.  May cause drowsiness. 01/19/22  Yes Eulogio Bear, NP  acetaminophen (TYLENOL) 500 MG tablet Take 1,000 mg by mouth every 6 (six) hours as needed for moderate pain or headache.    [provider]  aspirin 81 MG EC tablet Take 1 tablet (81 mg total) by mouth daily. Patient taking differently: Take 81 mg by mouth 2 (two) times a week. 12/04/16   Dunn, Nedra Hai, PA-C  atorvastatin (LIPITOR) 20 MG tablet Take 20 mg by mouth at bedtime.    [provider]  citalopram (CELEXA) 20 MG tablet Take 20 mg by mouth at bedtime.    [provider]  fluticasone (FLONASE) 50 MCG/ACT nasal spray Place 2 sprays  into both nostrils daily. 09/05/19   [provider]  levocetirizine (XYZAL) 5 MG tablet Take 5 mg by mouth every evening.    [provider]  Omega-3 Fatty Acids (OMEGA 3 PO) Take 1 capsule by mouth daily.    [provider]  omeprazole (PRILOSEC) 20 MG capsule Take 1 capsule (20 mg total) by mouth daily. 12/04/16 04/30/18  Charlie Pitter, PA-C    Family History Family History  Problem Relation Age of Onset   Arrhythmia Father    Heart attack Maternal Grandmother 64       died    Social History Social History    Tobacco Use   Smoking status: Never   Smokeless tobacco: Never  Vaping Use   Vaping Use: Never used  Substance Use Topics   Alcohol use: No   Drug use: No     Allergies   Bee venom, Azithromycin, and Flexeril [cyclobenzaprine]   Review of Systems Review of Systems Per HPI  Physical Exam Triage Vital Signs ED Triage Vitals  Enc Vitals Group     BP 01/19/22 1347 111/65     Pulse Rate 01/19/22 1347 64     Resp 01/19/22 1347 18     Temp 01/19/22 1347 98.9 F (37.2 C)     Temp Source 01/19/22 1347 Oral     SpO2 01/19/22 1347 96 %     Weight --      Height --      Head Circumference --      Peak Flow --      Pain Score 01/19/22 1345 2     Pain Loc --      Pain Edu? --      Excl. in [place]? --    No data found.  Updated Vital Signs BP 111/65 (BP Location: Right Arm)   Pulse 64   Temp 98.9 F (37.2 C) (Oral)   Resp 18   SpO2 96%   Visual Acuity Right Eye Distance:   Left Eye Distance:   Bilateral Distance:    Right Eye Near:   Left Eye Near:    Bilateral Near:     Physical Exam Vitals and nursing note reviewed.  Constitutional:      General: She is not in acute distress.    Appearance: Normal appearance. She is not ill-appearing or toxic-appearing.  HENT:     Head: Normocephalic and atraumatic.     Right Ear: Tympanic membrane, ear canal and external ear normal.     Left Ear: Tympanic membrane, ear canal and external ear normal.     Nose: Rhinorrhea present. No congestion.     Mouth/Throat:     Mouth: Mucous membranes are moist.     Pharynx: Oropharynx is clear. Posterior oropharyngeal erythema present. No oropharyngeal exudate.     Tonsils: 0 on the right.     Comments: Postnasal drainage Eyes:     General: No scleral icterus.    Extraocular Movements: Extraocular movements intact.  Cardiovascular:     Rate and Rhythm: Normal rate and regular rhythm.     Heart sounds: Normal heart sounds.  Pulmonary:     Effort: Pulmonary effort is normal. No  respiratory distress.     Breath sounds: Normal breath sounds. No wheezing, rhonchi or rales.  Abdominal:     General: Abdomen is flat. Bowel sounds are normal. There is no distension.     Palpations: Abdomen is soft.     Tenderness: There is no  abdominal tenderness. There is no guarding.  Musculoskeletal:     Cervical back: Normal range of motion and neck supple.  Lymphadenopathy:     Cervical: No cervical adenopathy.  Skin:    General: Skin is warm and dry.     Coloration: Skin is not jaundiced or pale.     Findings: No erythema or rash.  Neurological:     Mental Status: She is alert and oriented to person, place, and time.  Psychiatric:        Behavior: Behavior is cooperative.      UC Treatments / Results  Labs (all labs ordered are listed, but only abnormal results are displayed) Labs Reviewed  RESP PANEL BY RT-PCR (FLU A&B, COVID) [id number]    EKG   Radiology No results found.  Procedures Procedures (including critical care time)  Medications Ordered in UC Medications - No data to display  Initial Impression / Assessment and Plan / UC Course  I have reviewed the triage vital signs and the nursing notes.  Pertinent labs & imaging results that were available during my care of the patient were reviewed by me and considered in my medical decision making (see chart for details).   Patient is well-appearing, normotensive, afebrile, not tachycardic, not tachypneic, oxygenating well on room air.    Encounter for screening for COVID-19 Viral URI with cough COVID-19, influenza testing obtained Patient would be a good candidate for Paxlovid or Tamiflu if she test positive for either Supportive care discussed Start cough suppressants ER and return precautions discussed  The patient was given the opportunity to ask questions.  All questions answered to their satisfaction.  The patient is in agreement to this plan.    Final Clinical Impressions(s) / UC Diagnoses    Final diagnoses:  Encounter for screening for COVID-19  Viral URI with cough     Discharge Instructions      You have a viral upper respiratory infection.  This should improve over the next 10 days or so.  If you develop chest pain or shortness of breath, go to the emergency room.    We have tested you today for COVID-19 and influenza.  If you test positive for either of these, you would be a good candidate for Paxlovid and Tamiflu..  You will see the results in Mychart and we will call you with positive results.    Please stay home and isolate until you are aware of the results.    Some things that can make you feel better are: - Increased rest - Increasing fluid with water/sugar free electrolytes - Acetaminophen and ibuprofen as needed for fever/pain  - Salt water gargling, chloraseptic spray and throat lozenges - OTC guaifenesin (Mucinex) 600 mg twice daily - Saline sinus flushes or a neti pot - Humidifying the air - Tessalon perles as needed for dry cough      ED Prescriptions     Medication Sig Dispense Auth. Provider   benzonatate (TESSALON) 100 MG capsule Take 1 capsule (100 mg total) by mouth 3 (three) times daily as needed for cough. Do not take with alcohol or while driving or operating heavy machinery.  May cause drowsiness. 21 capsule Eulogio Bear, NP      PDMP not reviewed this encounter.   Eulogio Bear, NP 01/19/22 1434

## 2022-01-19 NOTE — ED Triage Notes (Signed)
Onset yesterday of symptoms.  Throat drainage, body aches, low grade fever, and coughing.  Patient is on Flonase and takes xyzal

## 2022-05-25 ENCOUNTER — Encounter: Payer: Self-pay | Admitting: Radiology

## 2022-06-26 DIAGNOSIS — Z6831 Body mass index (BMI) 31.0-31.9, adult: Secondary | ICD-10-CM | POA: Diagnosis not present

## 2022-06-26 DIAGNOSIS — J302 Other seasonal allergic rhinitis: Secondary | ICD-10-CM | POA: Diagnosis not present

## 2022-06-26 DIAGNOSIS — E6609 Other obesity due to excess calories: Secondary | ICD-10-CM | POA: Diagnosis not present

## 2022-07-18 DIAGNOSIS — Z6830 Body mass index (BMI) 30.0-30.9, adult: Secondary | ICD-10-CM | POA: Diagnosis not present

## 2022-07-18 DIAGNOSIS — E6609 Other obesity due to excess calories: Secondary | ICD-10-CM | POA: Diagnosis not present

## 2022-07-18 DIAGNOSIS — J45909 Unspecified asthma, uncomplicated: Secondary | ICD-10-CM | POA: Diagnosis not present

## 2022-07-18 DIAGNOSIS — M5136 Other intervertebral disc degeneration, lumbar region: Secondary | ICD-10-CM | POA: Diagnosis not present

## 2022-07-18 DIAGNOSIS — J0100 Acute maxillary sinusitis, unspecified: Secondary | ICD-10-CM | POA: Diagnosis not present

## 2022-07-18 DIAGNOSIS — J302 Other seasonal allergic rhinitis: Secondary | ICD-10-CM | POA: Diagnosis not present

## 2022-08-16 ENCOUNTER — Other Ambulatory Visit (HOSPITAL_COMMUNITY): Payer: Self-pay | Admitting: Obstetrics and Gynecology

## 2022-08-16 DIAGNOSIS — Z1231 Encounter for screening mammogram for malignant neoplasm of breast: Secondary | ICD-10-CM

## 2022-10-02 ENCOUNTER — Ambulatory Visit (HOSPITAL_COMMUNITY)
Admission: RE | Admit: 2022-10-02 | Discharge: 2022-10-02 | Disposition: A | Discharge status: Home / Self Care | Payer: Medicare PPO | Source: Ambulatory Visit | Attending: Obstetrics and Gynecology | Admitting: Obstetrics and Gynecology

## 2022-10-02 DIAGNOSIS — Z1231 Encounter for screening mammogram for malignant neoplasm of breast: Secondary | ICD-10-CM | POA: Insufficient documentation

## 2022-10-11 DIAGNOSIS — R102 Pelvic and perineal pain: Secondary | ICD-10-CM | POA: Diagnosis not present

## 2022-10-11 DIAGNOSIS — Z01411 Encounter for gynecological examination (general) (routine) with abnormal findings: Secondary | ICD-10-CM | POA: Diagnosis not present

## 2022-10-11 DIAGNOSIS — Z6831 Body mass index (BMI) 31.0-31.9, adult: Secondary | ICD-10-CM | POA: Diagnosis not present

## 2022-10-13 DIAGNOSIS — R102 Pelvic and perineal pain: Secondary | ICD-10-CM | POA: Diagnosis not present

## 2022-10-13 DIAGNOSIS — Z90710 Acquired absence of both cervix and uterus: Secondary | ICD-10-CM | POA: Diagnosis not present

## 2022-11-20 ENCOUNTER — Telehealth: Payer: Self-pay

## 2022-11-20 NOTE — Telephone Encounter (Signed)
Tried calling patient with no answer left vm for return call.

## 2022-11-21 ENCOUNTER — Telehealth: Payer: Self-pay

## 2022-11-21 NOTE — Telephone Encounter (Signed)
Open in error

## 2022-11-21 NOTE — Telephone Encounter (Signed)
-----   Message from Sara Lindsey sent at 11/20/2022  8:42 AM EDT ----- This is a new patient scheduled on Wednesday 8/28 for pelvic pain. Needs to see MD for first visit for that complaint. Please reschedule.

## 2022-11-21 NOTE — Telephone Encounter (Signed)
Tried calling patient with no answer, left vm for return call 

## 2022-11-22 ENCOUNTER — Ambulatory Visit: Payer: Medicare PPO | Admitting: Urology

## 2022-12-28 ENCOUNTER — Encounter: Payer: Self-pay | Admitting: Urology

## 2022-12-28 ENCOUNTER — Ambulatory Visit: Payer: Medicare PPO | Admitting: Urology

## 2022-12-28 VITALS — BP 121/76 | HR 65 | Ht 64.5 in | Wt 181.0 lb

## 2022-12-28 DIAGNOSIS — N952 Postmenopausal atrophic vaginitis: Secondary | ICD-10-CM | POA: Diagnosis not present

## 2022-12-28 DIAGNOSIS — R350 Frequency of micturition: Secondary | ICD-10-CM | POA: Diagnosis not present

## 2022-12-28 DIAGNOSIS — R8281 Pyuria: Secondary | ICD-10-CM

## 2022-12-28 DIAGNOSIS — N8111 Cystocele, midline: Secondary | ICD-10-CM | POA: Diagnosis not present

## 2022-12-28 DIAGNOSIS — R351 Nocturia: Secondary | ICD-10-CM | POA: Diagnosis not present

## 2022-12-28 DIAGNOSIS — R102 Pelvic and perineal pain: Secondary | ICD-10-CM

## 2022-12-28 NOTE — Progress Notes (Signed)
Subjective: 1. Pelvic pain in female   2. Urinary frequency   3. Cystocele, midline   4. Vaginal atrophy     12/29/22: Sara Lindsey is a 67 yo female who presents with a 3-4 month history of pelvic pain.  The pain is in both groins.  The pain has lessened.  It is worse on the right when she turns over while sleeping at night.  It is intermittent and a dull ache,  She had a negative pelvic US recent after seeing her Gyn.  She has an occasionally reduced stream.  She has some frequency and nocturia x 2 but no urgency or incontinence.  She has had no hematuria.  She has had no stones or recent UTI's.  She has had a hysterectomy but no GU surgery.   She has no GI complaints. She has no bulging in the groins.  ROS:  Review of Systems  Constitutional:  Positive for malaise/fatigue.  Gastrointestinal:  Positive for heartburn and nausea.  Musculoskeletal:  Positive for back pain and joint pain.  Neurological:  Positive for headaches.  Endo/Heme/Allergies:  Positive for polydipsia.    Allergies  Allergen Reactions   Bee Venom    Azithromycin Rash   Flexeril [Cyclobenzaprine] Palpitations and Other (See Comments)    Patient reports that it also caused dizziness    Past Medical History:  Diagnosis Date   Acid reflux    Anxiety    Depression    Elevated blood pressure reading without diagnosis of hypertension    Hyperlipidemia    Mild CAD    a. 11/2016 - cardiac catheterization today showing minor nonobstructive CAD with very mild stenosis at the LAD/diagonal bifurcation, otherwise normal coronary arteries. Normal LVEDP.   Obesity     Past Surgical History:  Procedure Laterality Date   ABDOMINAL HYSTERECTOMY     COLONOSCOPY N/A 05/15/2018   Procedure: COLONOSCOPY;  Surgeon: Malissa Hippo, MD;  [facility];  Service: Endoscopy;  Laterality: N/A;  730   LEFT HEART CATH AND CORONARY ANGIOGRAPHY N/A 12/04/2016   Procedure: LEFT HEART CATH AND CORONARY ANGIOGRAPHY;  Surgeon: Tonny Bollman, MD;  Location: [facility];  Service: Cardiovascular;  Laterality: N/A;    Social History   Socioeconomic History   Marital status: Married    Spouse name: Not on file   Number of children: Not on file   Years of education: Not on file   Highest education level: Not on file  Occupational History   Not on file  Tobacco Use   Smoking status: Never   Smokeless tobacco: Never  Vaping Use   Vaping status: Never Used  Substance and Sexual Activity   Alcohol use: No   Drug use: No   Sexual activity: Yes    Birth control/protection: Surgical  Other Topics Concern   Not on file  Social History Narrative   Not on file   Social Determinants of Health   Financial Resource Strain: Not on file  Food Insecurity: No Food Insecurity (03/10/2019)   Received from [facility], [facility]   Hunger Vital Sign    Worried About Running Out of Food in the Last Year: Never true    Ran Out of Food in the Last Year: Never true  Transportation Needs: No Transportation Needs (03/10/2019)   [facility] from [facility], [facility]   [facility] - Transportation    Lack of Transportation (Medical): No    Lack of Transportation (Non-Medical):  No  Physical Activity: Insufficiently Active (10/11/2022)   [facility]   Exercise Vital Sign    Days of Exercise per Week: 2 days    Minutes of Exercise per Session: 30 min  Stress: No Stress Concern Present (10/11/2022)   Received from [facility] - Occupational Stress Questionnaire    Feeling of Stress : Only a little  Social Connections: Not on file  Intimate Partner Violence: Not At Risk (10/11/2022)   Received from [facility]   Humiliation, Afraid, Rape, and Kick questionnaire    Fear of Current or Ex-Partner: No    Emotionally Abused: No    Physically Abused: No    Sexually Abused: No    Family History  Problem Relation Age of Onset   Arrhythmia  Father    Heart attack Maternal Grandmother 70       died    Anti-infectives: Anti-infectives (From admission, onward)    None       Current Outpatient Medications  Medication Sig Dispense Refill   acetaminophen (TYLENOL) 500 MG tablet Take 1,000 mg by mouth every 6 (six) hours as needed for moderate pain or headache.     aspirin 81 MG EC tablet Take 1 tablet (81 mg total) by mouth daily. (Patient taking differently: Take 81 mg by mouth 2 (two) times a week.)     atorvastatin (LIPITOR) 20 MG tablet Take 20 mg by mouth at bedtime.     benzonatate (TESSALON) 100 MG capsule Take 1 capsule (100 mg total) by mouth 3 (three) times daily as needed for cough. Do not take with alcohol or while driving or operating heavy machinery.  May cause drowsiness. 21 capsule 0   citalopram (CELEXA) 20 MG tablet Take 20 mg by mouth at bedtime.     fluticasone (FLONASE) 50 MCG/ACT nasal spray Place 2 sprays into both nostrils daily.     levocetirizine (XYZAL) 5 MG tablet Take 5 mg by mouth every evening.     Omega-3 Fatty Acids (OMEGA 3 PO) Take 1 capsule by mouth daily.     omeprazole (PRILOSEC) 20 MG capsule Take 1 capsule (20 mg total) by mouth daily. 30 capsule 0   No current facility-administered medications for this visit.     Objective: Vital signs in last 24 hours: BP 121/76   Pulse 65   Ht 5' 4.5" (1.638 m)   Wt 181 lb (82.1 kg)   BMI 30.59 kg/m   Intake/Output from previous day: No intake/output data recorded. Intake/Output this shift: @IOTHISSHIFT @   Physical Exam Vitals reviewed.  Constitutional:      Appearance: Normal appearance.  Abdominal:     General: Abdomen is flat.     Palpations: Abdomen is soft.     Tenderness: There is abdominal tenderness (mild suprapubic).     Hernia: No hernia is present.  Genitourinary:    Comments: Nl external genitalia. No introital stenosis.  Urethral meatus has mild prolapse without hypermobility. She has mod/severe mucosal atrophy  with thinning over a grade 2 cystocele.   No rectocele. There is bilateral levator tenderness that is consistent with her pain complaints, right > left. The bladder is minimally tender without mass.  Musculoskeletal:        General: No swelling or tenderness. Normal range of motion.  Skin:    General: Skin is warm and dry.  Neurological:     General: No focal deficit present.  Mental Status: She is alert and oriented to person, place, and time.     Lab Results:  Results for orders placed or performed in visit on 12/28/22 (from the past 24 hour(s))  Urinalysis, Routine w reflex microscopic     Status: Abnormal   Collection Time: 12/28/22  2:16 PM  Result Value Ref Range   Specific Gravity, UA 1.015 1.005 - 1.030   pH, UA 6.5 5.0 - 7.5   Color, UA Yellow Yellow   Appearance Ur Clear Clear   Leukocytes,UA 3+ (A) Negative   Protein,UA Negative Negative/Trace   Glucose, UA Negative Negative   Ketones, UA Trace (A) Negative   RBC, UA Negative Negative   Bilirubin, UA Negative Negative   Urobilinogen, Ur 1.0 0.2 - 1.0 mg/dL   Nitrite, UA Negative Negative   Microscopic Examination See below:    Narrative   Performed at:  [address] - [facility] [address] [place], [place]  [id number] Lab Director: Chinita Pester MT, Phone:  [phone]  Microscopic Examination     Status: Abnormal   Collection Time: 12/28/22  2:16 PM   Urine  Result Value Ref Range   WBC, UA 11-30 (A) 0 - 5 /hpf   RBC, Urine 0-2 0 - 2 /hpf   Epithelial Cells (non renal) >10 (A) 0 - 10 /hpf   Bacteria, UA Few (A) None seen/Few   Narrative   Performed at:  [address] - [facility] [address] [place], [place]  [id number] Lab Director: Chinita Pester MT, Phone:  [phone]    BMET No results for input(s): "NA", "K", "CL", "CO2", "GLUCOSE", "BUN", "CREATININE", "CALCIUM" in the last 72 hours. PT/INR No results for input(s): "LABPROT", "INR" in the last 72 hours. ABG No results for input(s):  "PHART", "HCO3" in the last 72 hours.  Invalid input(s): "PCO2", "PO2"  UA reviewed.  Studies/Results: No results found. [place] report reviewed.  Notes from GYN reviewed.   Assessment/Plan: Pelvic pain that localized to the levator complex.  I will get her set up for pelvic floor PT.  Cystocele with vaginal atrophy.  This is not symptomatic.  Pyuria with some frequency and nocturia.  I will get a culture but the abnormalities are probably a vaginal contaminant from the cystocele.   No orders of the defined types were placed in this encounter.    Orders Placed This Encounter  Procedures   Urine Culture   Microscopic Examination   Urinalysis, Routine w reflex microscopic   Ambulatory referral to Physical Therapy    Referral Priority:   Routine    Referral Type:   Physical Medicine    Referral Reason:   Specialty Services Required    Requested Specialty:   Physical Therapy    Number of Visits Requested:   1     Return in about 3 months (around 03/30/2023).    CC: Dr. Assunta Found and  Dr. Silas Flood     Bjorn Pippin 12/29/2022

## 2022-12-29 LAB — URINALYSIS, ROUTINE W REFLEX MICROSCOPIC
Bilirubin, UA: NEGATIVE
Glucose, UA: NEGATIVE
Nitrite, UA: NEGATIVE
Protein,UA: NEGATIVE
RBC, UA: NEGATIVE
Specific Gravity, UA: 1.015 (ref 1.005–1.030)
Urobilinogen, Ur: 1 mg/dL (ref 0.2–1.0)
pH, UA: 6.5 (ref 5.0–7.5)

## 2022-12-29 LAB — MICROSCOPIC EXAMINATION: Epithelial Cells (non renal): >10 /[HPF] — AB (ref 0–10)

## 2022-12-30 LAB — URINE CULTURE

## 2023-01-01 DIAGNOSIS — H2513 Age-related nuclear cataract, bilateral: Secondary | ICD-10-CM | POA: Diagnosis not present

## 2023-01-08 ENCOUNTER — Other Ambulatory Visit: Payer: Self-pay

## 2023-01-08 ENCOUNTER — Ambulatory Visit: Payer: Medicare PPO | Attending: Urology | Admitting: Physical Therapy

## 2023-01-08 ENCOUNTER — Encounter: Payer: Self-pay | Admitting: Physical Therapy

## 2023-01-08 DIAGNOSIS — R102 Pelvic and perineal pain: Secondary | ICD-10-CM | POA: Diagnosis not present

## 2023-01-08 DIAGNOSIS — M6281 Muscle weakness (generalized): Secondary | ICD-10-CM | POA: Diagnosis not present

## 2023-01-08 DIAGNOSIS — R278 Other lack of coordination: Secondary | ICD-10-CM | POA: Diagnosis not present

## 2023-01-08 NOTE — Therapy (Signed)
OUTPATIENT PHYSICAL THERAPY FEMALE PELVIC EVALUATION   Patient Name: Sara Lindsey MRN: [id number] DOB:June 02, 1955, 67 y.o., female Today's Date: 01/08/2023  END OF SESSION:  PT End of Session - 01/08/23 1816     Visit Number 1    Date for PT Re-Evaluation 02/05/23    Progress Note Due on Visit 0.04    PT Start Time 1445    PT Stop Time 1540    PT Time Calculation (min) 55 min    Activity Tolerance Patient tolerated treatment well    Behavior During Therapy [facility] for tasks assessed/performed             Past Medical History:  Diagnosis Date   Acid reflux    Anxiety    Depression    Elevated blood pressure reading without diagnosis of hypertension    Hyperlipidemia    Mild CAD    a. 11/2016 - cardiac catheterization today showing minor nonobstructive CAD with very mild stenosis at the LAD/diagonal bifurcation, otherwise normal coronary arteries. Normal LVEDP.   Obesity    Past Surgical History:  Procedure Laterality Date   ABDOMINAL HYSTERECTOMY     COLONOSCOPY N/A 05/15/2018   Procedure: COLONOSCOPY;  Surgeon: Malissa Hippo, MD;  [facility];  Service: Endoscopy;  Laterality: N/A;  730   LEFT HEART CATH AND CORONARY ANGIOGRAPHY N/A 12/04/2016   Procedure: LEFT HEART CATH AND CORONARY ANGIOGRAPHY;  Surgeon: Tonny Bollman, MD;  Location: [facility] INVASIVE CV LAB;  Service: Cardiovascular;  Laterality: N/A;   Patient Active Problem List   Diagnosis Date Noted   Pain in left knee 07/13/2021   Hematoma of lower leg 05/05/2020   Pain in right knee 04/14/2020   Subclinical hyperthyroidism 10/10/2019   Abnormal thyroid blood test 09/10/2019   History of colonic polyps 01/10/2018   Class 1 obesity due to excess calories with serious comorbidity and body mass index (BMI) of 32.0 to 32.9 in adult 12/04/2016   Elevated blood pressure reading without diagnosis of hypertension 12/04/2016   Mild CAD 12/04/2016   Chest pain at rest    Mixed hyperlipidemia 12/02/2016     PCP: Assunta Found, MD PCP - General  REFERRING PROVIDER: Bjorn Pippin, MD Ref Provider  REFERRING DIAG: R10.2 (ICD-10-CM) - Pelvic pain in female   THERAPY DIAG:  Muscle weakness (generalized)  Other lack of coordination  Rationale for Evaluation and Treatment: Rehabilitation  ONSET DATE: 07/2022  SUBJECTIVE:  SUBJECTIVE STATEMENT: Pt reports that she has had this pain about 4-5 months ago. She said it felt like ovulation. She had a hysterectomy in 1996.  Pt reports that when Dr Wilson Singer did her pelvic exam, he aggravated it.   At night when she is on the back, it hurts more, when she rolls over.  Pt reports that she has been picking up her grandson and they have been remodeling, picking up boxes.    Fluid intake: Yes: when she drinks water, she can urinate every 30 mins    PAIN:  Are you having pain? Yes NPRS scale: 4-5/10- uncomfortable Pain location: Internal and Vaginal  Pain type: dull Pain description: constant   Aggravating factors: rolling over  Relieving factors: advil, sitting in a recliner  PRECAUTIONS: none  RED FLAGS: None   WEIGHT BEARING RESTRICTIONS: No  FALLS:  Has patient fallen in last 6 months? No  LIVING ENVIRONMENT: Lives with: lives with their spouse Lives in: House/apartment Stairs: No Has following equipment at home: None  OCCUPATION: retired  PLOF: Independent  PATIENT GOALS: to have reduced abdominal/ pelvic pain  PERTINENT HISTORY:   Sexual abuse: No  BOWEL MOVEMENT: Pain with bowel movement: No Type of bowel movement:Strain No Fully empty rectum: Yes: - but sometimes constipated Leakage: No Pads: No Fiber supplement: No  URINATION: Pain with urination: No Fully empty bladder: No especially at night, has to sit there Stream:  Strong Urgency: Yes:   Frequency: yes, gets up at night Leakage:  no Pads: No  INTERCOURSE: Pain with intercourse: does not have intercourse  PREGNANCY: Vaginal deliveries 2 Tearing episiotomy C-section deliveries 0 Currently pregnant No  PROLAPSE: Cystocele Dr Wilson Singer saw   OBJECTIVE:  Note: Objective measures were completed at Evaluation unless otherwise noted.  COGNITION: Overall cognitive status: Within functional limits for tasks assessed     SENSATION: Light touch: Appears intact Proprioception: Appears intact   POSTURE: rounded shoulders, forward head, decreased lumbar lordosis, and posterior pelvic tilt  PELVIC ALIGNMENT: posterior pelvic tilt present  LUMBARAROM/PROM:  A/PROM A/PROM  eval  Flexion Grossly WFL  Extension   Right lateral flexion   Left lateral flexion   Right rotation   Left rotation    (Blank rows = not tested)  LOWER EXTREMITY ROM:  Passive ROM Right eval Left eval  Hip flexion [facility]  Hip extension    Hip abduction    Hip adduction [facility]  Hip internal rotation    Hip external rotation    Knee flexion    Knee extension    Ankle dorsiflexion    Ankle plantarflexion    Ankle inversion    Ankle eversion     (Blank rows = not tested)  LOWER EXTREMITY MMT:  MMT Right eval Left eval  Hip flexion 4/5 4/5  Hip extension    Hip abduction    Hip adduction 4/5 4/5  Hip internal rotation    Hip external rotation    Knee flexion    Knee extension    Ankle dorsiflexion    Ankle plantarflexion    Ankle inversion    Ankle eversion     PALPATION:   General  WFL                External Perineal Exam [facility]                             Internal Pelvic Floor - no tenderness  or tightness  Patient confirms identification and approves PT to assess internal pelvic floor and treatment Yes  PELVIC MMT:   MMT eval  Vaginal 1/5  Internal Anal Sphincter   External Anal Sphincter   Puborectalis   Diastasis Recti Low abdominal  tone  (Blank rows = not tested)        TONE: low  PROLAPSE: Yes- anterior and posterior wall laxity noticed, stool in rectum   TODAY'S TREATMENT:                                                                                                                              DATE:   EVAL see above   PATIENT EDUCATION:  Education details: pt educated on relevant anatomy, exercises, pressure management, HEP and posture Person educated: Patient Education method: Explanation and Handouts Education comprehension: verbalized understanding and needs further education  HOME EXERCISE PROGRAM: [id number]  ASSESSMENT:  CLINICAL IMPRESSION: Patient is a 67 y.o. female who was seen today for physical therapy evaluation and treatment for pelvic pain and cystocele. She presents with anterior vaginal wall laxity, posterior vaginal wall laxity with stool in rectum, decreased coordination of breathing and pelvic floor, decreased awareness of her pelvic floor, abdominal weakness, poor posture, upper chest breathing and breath holding strategies. She did well today with internal assessment, education, exercises and will benefit from physical therapy to reduce pain and improve voiding. Her main complaint is low abdominal pain, pressure and heaviness and incomplete bladder emptying, urinary frequency and nocturia.   OBJECTIVE IMPAIRMENTS: decreased coordination, decreased knowledge of condition, difficulty walking, decreased ROM, decreased strength, impaired tone, and pain.   ACTIVITY LIMITATIONS: carrying, lifting, standing, sleeping, and caring for others  PARTICIPATION LIMITATIONS: cleaning  PERSONAL FACTORS: Time since onset of injury/illness/exacerbation are also affecting patient's functional outcome.   REHAB POTENTIAL: Good  CLINICAL DECISION MAKING: Stable/uncomplicated  EVALUATION COMPLEXITY: Moderate   GOALS: Goals reviewed with patient? Yes  SHORT TERM GOALS: Target date: 02/05/2023     Patient will report reduced pressure and heaviness in their pelvic floor to maximum 3/10 for at least 4-6 hours/day (10  being the worst)  Baseline: Goal status: INITIAL  2.  Patient will report maximum 3/10 abdominal/ pelvic pain to improve mobility and quality of life  Baseline:  Goal status: INITIAL  3. Pt will be independent and compliant with HEP and perform all exercises correctly  Baseline:  Goal status: INITIAL   LONG TERM GOALS: Target date: 03/05/2023    Pt will be independent with advanced HEP.   Baseline:  Goal status: INITIAL  2.  Pt will be independent with the knack, urge suppression technique, and double voiding in order to improve bladder habits and improve quality of life Baseline:  Goal status: INITIAL  3.  Pt will be able pick up her grandson without increased pain Baseline:  Goal status: INITIAL  4.  Pt will have reduced frequency or urination at night to max 1  time in order to improve quality of life.  Baseline:  Goal status: INITIAL   PLAN:  PT FREQUENCY: 1-2x/week  PT DURATION:  8 sessions  PLANNED INTERVENTIONS: 97110-Therapeutic exercises, 97530- Therapeutic activity, 97112- Neuromuscular re-education, 97535- Self Care, 16109- Manual therapy, [phone]- Electrical stimulation (manual), Dry Needling, Joint mobilization, Joint manipulation, Spinal manipulation, Spinal mobilization, Scar mobilization, and Biofeedback  PLAN FOR NEXT SESSION: continue with pressure management and core exercises for cystocele   Tahj Njoku, PT 01/08/23 6:19 PM

## 2023-01-15 ENCOUNTER — Ambulatory Visit: Payer: Self-pay | Admitting: Physical Therapy

## 2023-01-15 DIAGNOSIS — R7309 Other abnormal glucose: Secondary | ICD-10-CM | POA: Diagnosis not present

## 2023-01-15 DIAGNOSIS — Z1331 Encounter for screening for depression: Secondary | ICD-10-CM | POA: Diagnosis not present

## 2023-01-15 DIAGNOSIS — Z23 Encounter for immunization: Secondary | ICD-10-CM | POA: Diagnosis not present

## 2023-01-15 DIAGNOSIS — Z0001 Encounter for general adult medical examination with abnormal findings: Secondary | ICD-10-CM | POA: Diagnosis not present

## 2023-01-15 DIAGNOSIS — M6281 Muscle weakness (generalized): Secondary | ICD-10-CM

## 2023-01-15 DIAGNOSIS — E782 Mixed hyperlipidemia: Secondary | ICD-10-CM | POA: Diagnosis not present

## 2023-01-15 DIAGNOSIS — R278 Other lack of coordination: Secondary | ICD-10-CM | POA: Diagnosis not present

## 2023-01-15 DIAGNOSIS — R102 Pelvic and perineal pain: Secondary | ICD-10-CM | POA: Diagnosis not present

## 2023-01-15 DIAGNOSIS — E6609 Other obesity due to excess calories: Secondary | ICD-10-CM | POA: Diagnosis not present

## 2023-01-15 DIAGNOSIS — E0590 Thyrotoxicosis, unspecified without thyrotoxic crisis or storm: Secondary | ICD-10-CM | POA: Diagnosis not present

## 2023-01-15 DIAGNOSIS — Z6830 Body mass index (BMI) 30.0-30.9, adult: Secondary | ICD-10-CM | POA: Diagnosis not present

## 2023-01-15 NOTE — Therapy (Signed)
OUTPATIENT PHYSICAL THERAPY FEMALE PELVIC EVALUATION   Patient Name: Sara Lindsey MRN: [id number] DOB:[birth year], 67 y.o., female Today's Date: 01/16/2023  END OF SESSION:  PT End of Session - 01/15/23 1449     Visit Number 2    Date for PT Re-Evaluation 02/05/23    Authorization Time Period [employer] Approved 8 visits-01/08/2023-03/05/2023-[employer]# [id number]  [employer] [id number]    PT Start Time 1400    PT Stop Time 1450    PT Time Calculation (min) 50 min    Activity Tolerance Patient tolerated treatment well    Behavior During Therapy [facility] for tasks assessed/performed              Past Medical History:  Diagnosis Date   Acid reflux    Anxiety    Depression    Elevated blood pressure reading without diagnosis of hypertension    Hyperlipidemia    Mild CAD    a. 11/2016 - cardiac catheterization today showing minor nonobstructive CAD with very mild stenosis at the LAD/diagonal bifurcation, otherwise normal coronary arteries. Normal LVEDP.   Obesity    Past Surgical History:  Procedure Laterality Date   ABDOMINAL HYSTERECTOMY     COLONOSCOPY N/A 05/15/2018   Procedure: COLONOSCOPY;  Surgeon: Malissa Hippo, MD;  [facility];  Service: Endoscopy;  Laterality: N/A;  730   LEFT HEART CATH AND CORONARY ANGIOGRAPHY N/A 12/04/2016   Procedure: LEFT HEART CATH AND CORONARY ANGIOGRAPHY;  Surgeon: Tonny Bollman, MD;  Location: [facility] INVASIVE CV LAB;  Service: Cardiovascular;  Laterality: N/A;   Patient Active Problem List   Diagnosis Date Noted   Pain in left knee 07/13/2021   Hematoma of lower leg 05/05/2020   Pain in right knee 04/14/2020   Subclinical hyperthyroidism 10/10/2019   Abnormal thyroid blood test 09/10/2019   History of colonic polyps 01/10/2018   Class 1 obesity due to excess calories with serious comorbidity and body mass index (BMI) of 32.0 to 32.9 in adult 12/04/2016   Elevated blood pressure reading without diagnosis of hypertension 12/04/2016   Mild  CAD 12/04/2016   Chest pain at rest    Mixed hyperlipidemia 12/02/2016    PCP: Assunta Found, MD PCP - General  REFERRING PROVIDER: Bjorn Pippin, MD Ref Provider  REFERRING DIAG: R10.2 (ICD-10-CM) - Pelvic pain in female   THERAPY DIAG:  Muscle weakness (generalized)  Other lack of coordination  Rationale for Evaluation and Treatment: Rehabilitation  ONSET DATE: 07/2022  SUBJECTIVE:  SUBJECTIVE STATEMENT: Pt reports that she is doing well, pain is a little better. Frequency is better. [name] reports that she hopes that she is doing her exercises right.    Pt reports that she has had this pain about 4-5 months ago. She said it felt like ovulation. She had a hysterectomy in 1996.  Pt reports that when Dr Wilson Singer did her pelvic exam, he aggravated it.   At night when she is on the back, it hurts more, when she rolls over.  Pt reports that she has been picking up her grandson and they have been remodeling, picking up boxes.    Fluid intake: Yes: when she drinks water, she can urinate every 30 mins    PAIN:  Are you having pain? Yes NPRS scale: 4-5/10- uncomfortable Pain location: Internal and Vaginal  Pain type: dull Pain description: constant   Aggravating factors: rolling over  Relieving factors: advil, sitting in a recliner  PRECAUTIONS: none  RED FLAGS: None   WEIGHT BEARING RESTRICTIONS: No  FALLS:  Has patient fallen in last 6 months? No  LIVING ENVIRONMENT: Lives with: lives with their spouse Lives in: House/apartment Stairs: No Has following equipment at home: None  OCCUPATION: retired  PLOF: Independent  PATIENT GOALS: to have reduced abdominal/ pelvic pain  PERTINENT HISTORY:   Sexual abuse: No  BOWEL MOVEMENT: Pain with bowel movement: No Type of bowel  movement:Strain No Fully empty rectum: Yes: - but sometimes constipated Leakage: No Pads: No Fiber supplement: No  URINATION: Pain with urination: No Fully empty bladder: No especially at night, has to sit there Stream: Strong Urgency: Yes:   Frequency: yes, gets up at night Leakage:  no Pads: No  INTERCOURSE: Pain with intercourse: does not have intercourse  PREGNANCY: Vaginal deliveries 2 Tearing episiotomy C-section deliveries 0 Currently pregnant No  PROLAPSE: Cystocele Dr Wilson Singer saw   OBJECTIVE:  Note: Objective measures were completed at Evaluation unless otherwise noted.  COGNITION: Overall cognitive status: Within functional limits for tasks assessed     SENSATION: Light touch: Appears intact Proprioception: Appears intact   POSTURE: rounded shoulders, forward head, decreased lumbar lordosis, and posterior pelvic tilt  PELVIC ALIGNMENT: posterior pelvic tilt present  LUMBARAROM/PROM:  A/PROM A/PROM  eval  Flexion Grossly WFL  Extension   Right lateral flexion   Left lateral flexion   Right rotation   Left rotation    (Blank rows = not tested)  LOWER EXTREMITY ROM:  Passive ROM Right eval Left eval  Hip flexion [facility]  Hip extension    Hip abduction    Hip adduction [facility]  Hip internal rotation    Hip external rotation    Knee flexion    Knee extension    Ankle dorsiflexion    Ankle plantarflexion    Ankle inversion    Ankle eversion     (Blank rows = not tested)  LOWER EXTREMITY MMT:  MMT Right eval Left eval  Hip flexion 4/5 4/5  Hip extension    Hip abduction    Hip adduction 4/5 4/5  Hip internal rotation    Hip external rotation    Knee flexion    Knee extension    Ankle dorsiflexion    Ankle plantarflexion    Ankle inversion    Ankle eversion     PALPATION:   General  [facility]                External Perineal Exam [facility]  Internal Pelvic Floor - no tenderness or tightness  Patient  confirms identification and approves PT to assess internal pelvic floor and treatment Yes  PELVIC MMT:   MMT eval  Vaginal 1/5  Internal Anal Sphincter   External Anal Sphincter   Puborectalis   Diastasis Recti Low abdominal tone  (Blank rows = not tested)        TONE: low  PROLAPSE: Yes- anterior and posterior wall laxity noticed, stool in rectum   TODAY'S TREATMENT:                                                                                                                              DATE:  01/16/23     Neuro reed- hip adduction with ball with TRANSVERSE ABDOMINIS BREATH  Ball press with TRANSVERSE ABDOMINIS BREATH - supine and seated STS with #10 Squat to table #20 KB        -    PATIENT EDUCATION:  Education details: pt educated on relevant anatomy, exercises, pressure management, HEP and posture Person educated: Patient Education method: Explanation and Handouts Education comprehension: verbalized understanding and needs further education  HOME EXERCISE PROGRAM: [id number]  ASSESSMENT:  CLINICAL IMPRESSION:  Pt did well with her exercises today, tends to breathe the opposite and req VC's, TC's and explanation for good form and pressure management and breathing strategies to engage her core.      Patient is a 67 y.o. female who was seen today for physical therapy evaluation and treatment for pelvic pain and cystocele. She presents with anterior vaginal wall laxity, posterior vaginal wall laxity with stool in rectum, decreased coordination of breathing and pelvic floor, decreased awareness of her pelvic floor, abdominal weakness, poor posture, upper chest breathing and breath holding strategies. She did well today with internal assessment, education, exercises and will benefit from physical therapy to reduce pain and improve voiding. Her main complaint is low abdominal pain, pressure and heaviness and incomplete bladder emptying, urinary frequency and  nocturia.   OBJECTIVE IMPAIRMENTS: decreased coordination, decreased knowledge of condition, difficulty walking, decreased ROM, decreased strength, impaired tone, and pain.   ACTIVITY LIMITATIONS: carrying, lifting, standing, sleeping, and caring for others  PARTICIPATION LIMITATIONS: cleaning  PERSONAL FACTORS: Time since onset of injury/illness/exacerbation are also affecting patient's functional outcome.   REHAB POTENTIAL: Good  CLINICAL DECISION MAKING: Stable/uncomplicated  EVALUATION COMPLEXITY: Moderate   GOALS: Goals reviewed with patient? Yes  SHORT TERM GOALS: Target date: 02/05/2023    Patient will report reduced pressure and heaviness in their pelvic floor to maximum 3/10 for at least 4-6 hours/day (10  being the worst)  Baseline: Goal status: INITIAL  2.  Patient will report maximum 3/10 abdominal/ pelvic pain to improve mobility and quality of life  Baseline:  Goal status: INITIAL  3. Pt will be independent and compliant with HEP and perform all exercises correctly  Baseline:  Goal status: INITIAL   LONG TERM GOALS: Target date: 03/05/2023  Pt will be independent with advanced HEP.   Baseline:  Goal status: INITIAL  2.  Pt will be independent with the knack, urge suppression technique, and double voiding in order to improve bladder habits and improve quality of life Baseline:  Goal status: INITIAL  3.  Pt will be able pick up her grandson without increased pain Baseline:  Goal status: INITIAL  4.  Pt will have reduced frequency or urination at night to max 1 time in order to improve quality of life.  Baseline:  Goal status: INITIAL   PLAN:  PT FREQUENCY: 1-2x/week  PT DURATION:  8 sessions  PLANNED INTERVENTIONS: 97110-Therapeutic exercises, 97530- Therapeutic activity, 97112- Neuromuscular re-education, 97535- Self Care, 96045- Manual therapy, [phone]- Electrical stimulation (manual), Dry Needling, Joint mobilization, Joint manipulation,  Spinal manipulation, Spinal mobilization, Scar mobilization, and Biofeedback  PLAN FOR NEXT SESSION: continue with pressure management and core exercises for cystocele   Tienna Bienkowski, PT 01/16/23 8:51 AM

## 2023-01-22 ENCOUNTER — Ambulatory Visit: Payer: Medicare PPO | Admitting: Physical Therapy

## 2023-01-22 DIAGNOSIS — R278 Other lack of coordination: Secondary | ICD-10-CM | POA: Diagnosis not present

## 2023-01-22 DIAGNOSIS — M6281 Muscle weakness (generalized): Secondary | ICD-10-CM | POA: Diagnosis not present

## 2023-01-22 DIAGNOSIS — R102 Pelvic and perineal pain: Secondary | ICD-10-CM | POA: Diagnosis not present

## 2023-01-22 NOTE — Therapy (Signed)
OUTPATIENT PHYSICAL THERAPY FEMALE PELVIC EVALUATION   Patient Name: Sara Lindsey MRN: [id number] DOB:July 22, 1955, 67 y.o., female Today's Date: 01/22/2023  END OF SESSION:  PT End of Session - 01/22/23 1440     Visit Number 3    Date for PT Re-Evaluation 02/05/23    Authorization Time Period COHERE Approved 8 visits-01/08/2023-03/05/2023-Autho# [id number]  [employer] [id number]    PT Start Time 1400    PT Stop Time 1440    PT Time Calculation (min) 40 min    Activity Tolerance Patient tolerated treatment well    Behavior During Therapy [facility] for tasks assessed/performed               Past Medical History:  Diagnosis Date   Acid reflux    Anxiety    Depression    Elevated blood pressure reading without diagnosis of hypertension    Hyperlipidemia    Mild CAD    a. 11/2016 - cardiac catheterization today showing minor nonobstructive CAD with very mild stenosis at the LAD/diagonal bifurcation, otherwise normal coronary arteries. Normal LVEDP.   Obesity    Past Surgical History:  Procedure Laterality Date   ABDOMINAL HYSTERECTOMY     COLONOSCOPY N/A 05/15/2018   Procedure: COLONOSCOPY;  Surgeon: Malissa Hippo, MD;  [facility];  Service: Endoscopy;  Laterality: N/A;  730   LEFT HEART CATH AND CORONARY ANGIOGRAPHY N/A 12/04/2016   Procedure: LEFT HEART CATH AND CORONARY ANGIOGRAPHY;  Surgeon: Tonny Bollman, MD;  Location: [facility] INVASIVE CV LAB;  Service: Cardiovascular;  Laterality: N/A;   Patient Active Problem List   Diagnosis Date Noted   Pain in left knee 07/13/2021   Hematoma of lower leg 05/05/2020   Pain in right knee 04/14/2020   Subclinical hyperthyroidism 10/10/2019   Abnormal thyroid blood test 09/10/2019   History of colonic polyps 01/10/2018   Class 1 obesity due to excess calories with serious comorbidity and body mass index (BMI) of 32.0 to 32.9 in adult 12/04/2016   Elevated blood pressure reading without diagnosis of hypertension 12/04/2016   Mild  CAD 12/04/2016   Chest pain at rest    Mixed hyperlipidemia 12/02/2016    PCP: Assunta Found, MD PCP - General  REFERRING PROVIDER: Bjorn Pippin, MD Ref Provider  REFERRING DIAG: R10.2 (ICD-10-CM) - Pelvic pain in female   THERAPY DIAG:  Muscle weakness (generalized)  Other lack of coordination  Rationale for Evaluation and Treatment: Rehabilitation  ONSET DATE: 07/2022  SUBJECTIVE:  SUBJECTIVE STATEMENT: Pt reports that her pain is better, frequency is better, frequency is less.    Pt reports that she is doing well, pain is a little better. Frequency is better. [name] reports that she hopes that she is doing her exercises right.    Pt reports that she has had this pain about 4-5 months ago. She said it felt like ovulation. She had a hysterectomy in 1996.  Pt reports that when Dr Wilson Singer did her pelvic exam, he aggravated it.   At night when she is on the back, it hurts more, when she rolls over.  Pt reports that she has been picking up her grandson and they have been remodeling, picking up boxes.    Fluid intake: Yes: when she drinks water, she can urinate every 30 mins    PAIN:  Are you having pain? Yes NPRS scale: 4-5/10- uncomfortable Pain location: Internal and Vaginal  Pain type: dull Pain description: constant   Aggravating factors: rolling over  Relieving factors: advil, sitting in a recliner  PRECAUTIONS: none  RED FLAGS: None   WEIGHT BEARING RESTRICTIONS: No  FALLS:  Has patient fallen in last 6 months? No  LIVING ENVIRONMENT: Lives with: lives with their spouse Lives in: House/apartment Stairs: No Has following equipment at home: None  OCCUPATION: retired  PLOF: Independent  PATIENT GOALS: to have reduced abdominal/ pelvic pain  PERTINENT HISTORY:   Sexual  abuse: No  BOWEL MOVEMENT: Pain with bowel movement: No Type of bowel movement:Strain No Fully empty rectum: Yes: - but sometimes constipated Leakage: No Pads: No Fiber supplement: No  URINATION: Pain with urination: No Fully empty bladder: No especially at night, has to sit there Stream: Strong Urgency: Yes:   Frequency: yes, gets up at night Leakage:  no Pads: No  INTERCOURSE: Pain with intercourse: does not have intercourse  PREGNANCY: Vaginal deliveries 2 Tearing episiotomy C-section deliveries 0 Currently pregnant No  PROLAPSE: Cystocele Dr Wilson Singer saw   OBJECTIVE:  Note: Objective measures were completed at Evaluation unless otherwise noted.  COGNITION: Overall cognitive status: Within functional limits for tasks assessed     SENSATION: Light touch: Appears intact Proprioception: Appears intact   POSTURE: rounded shoulders, forward head, decreased lumbar lordosis, and posterior pelvic tilt  PELVIC ALIGNMENT: posterior pelvic tilt present  LUMBARAROM/PROM:  A/PROM A/PROM  eval  Flexion Grossly WFL  Extension   Right lateral flexion   Left lateral flexion   Right rotation   Left rotation    (Blank rows = not tested)  LOWER EXTREMITY ROM:  Passive ROM Right eval Left eval  Hip flexion [facility]  Hip extension    Hip abduction    Hip adduction [facility]  Hip internal rotation    Hip external rotation    Knee flexion    Knee extension    Ankle dorsiflexion    Ankle plantarflexion    Ankle inversion    Ankle eversion     (Blank rows = not tested)  LOWER EXTREMITY MMT:  MMT Right eval Left eval  Hip flexion 4/5 4/5  Hip extension    Hip abduction    Hip adduction 4/5 4/5  Hip internal rotation    Hip external rotation    Knee flexion    Knee extension    Ankle dorsiflexion    Ankle plantarflexion    Ankle inversion    Ankle eversion     PALPATION:   General  [facility]  External Perineal Exam [facility]                              Internal Pelvic Floor - no tenderness or tightness  Patient confirms identification and approves PT to assess internal pelvic floor and treatment Yes  PELVIC MMT:   MMT eval  Vaginal 1/5  Internal Anal Sphincter   External Anal Sphincter   Puborectalis   Diastasis Recti Low abdominal tone  (Blank rows = not tested)        TONE: low  PROLAPSE: Yes- anterior and posterior wall laxity noticed, stool in rectum   TODAY'S TREATMENT:                                                                                                                              DATE:  01/22/23     Neuro reed- hip adduction with ball with TRANSVERSE ABDOMINIS BREATH  Ball press with TRANSVERSE ABDOMINIS BREATH - supine and seated STS with #10 Squat to table #20 KB Rowing with TB Bridging with TRANSVERSE ABDOMINIS BREATH Bird dog STS with PF contraction and TRANSVERSE ABDOMINIS BREATH on descent KB dead lift #20           -    PATIENT EDUCATION:  Education details: pt educated on relevant anatomy, exercises, pressure management, HEP and posture Person educated: Patient Education method: Explanation and Handouts Education comprehension: verbalized understanding and needs further education  HOME EXERCISE PROGRAM: [id number]  ASSESSMENT:  CLINICAL IMPRESSION: Pt progressing well, has been consistent with her HEP, feeling progress. Gets up at night 1 time, decreasing drinking at night. Reported that she does not want surgery. Has not ever had any leakage with cough. Reported  that she is understanding pressure management better now. Pressure management exercises seemed better today.   Pt did well with her exercises today, tends to breathe the opposite and req VC's, TC's and explanation for good form and pressure management and breathing strategies to engage her core.      Patient is a 67 y.o. female who was seen today for physical therapy evaluation and treatment for pelvic pain  and cystocele. She presents with anterior vaginal wall laxity, posterior vaginal wall laxity with stool in rectum, decreased coordination of breathing and pelvic floor, decreased awareness of her pelvic floor, abdominal weakness, poor posture, upper chest breathing and breath holding strategies. She did well today with internal assessment, education, exercises and will benefit from physical therapy to reduce pain and improve voiding. Her main complaint is low abdominal pain, pressure and heaviness and incomplete bladder emptying, urinary frequency and nocturia.   OBJECTIVE IMPAIRMENTS: decreased coordination, decreased knowledge of condition, difficulty walking, decreased ROM, decreased strength, impaired tone, and pain.   ACTIVITY LIMITATIONS: carrying, lifting, standing, sleeping, and caring for others  PARTICIPATION LIMITATIONS: cleaning  PERSONAL FACTORS: Time since onset of injury/illness/exacerbation are also affecting patient's functional outcome.   REHAB  POTENTIAL: Good  CLINICAL DECISION MAKING: Stable/uncomplicated  EVALUATION COMPLEXITY: Moderate   GOALS: Goals reviewed with patient? Yes  SHORT TERM GOALS: Target date: 02/05/2023    Patient will report reduced pressure and heaviness in their pelvic floor to maximum 3/10 for at least 4-6 hours/day (10  being the worst)  Baseline: Goal status: INITIAL  2.  Patient will report maximum 3/10 abdominal/ pelvic pain to improve mobility and quality of life  Baseline:  Goal status: INITIAL  3. Pt will be independent and compliant with HEP and perform all exercises correctly  Baseline:  Goal status: INITIAL   LONG TERM GOALS: Target date: 03/05/2023    Pt will be independent with advanced HEP.   Baseline:  Goal status: INITIAL  2.  Pt will be independent with the knack, urge suppression technique, and double voiding in order to improve bladder habits and improve quality of life Baseline:  Goal status: INITIAL  3.  Pt  will be able pick up her grandson without increased pain Baseline:  Goal status: INITIAL  4.  Pt will have reduced frequency or urination at night to max 1 time in order to improve quality of life.  Baseline:  Goal status: INITIAL   PLAN:  PT FREQUENCY: 1-2x/week  PT DURATION:  8 sessions  PLANNED INTERVENTIONS: 97110-Therapeutic exercises, 97530- Therapeutic activity, 97112- Neuromuscular re-education, 97535- Self Care, 16109- Manual therapy, [phone]- Electrical stimulation (manual), Dry Needling, Joint mobilization, Joint manipulation, Spinal manipulation, Spinal mobilization, Scar mobilization, and Biofeedback  PLAN FOR NEXT SESSION: continue with pressure management and core exercises for cystocele   Edwina Grossberg, PT 01/22/23 2:42 PM

## 2023-01-30 ENCOUNTER — Ambulatory Visit: Payer: Medicare PPO | Attending: Urology | Admitting: Physical Therapy

## 2023-02-05 ENCOUNTER — Ambulatory Visit: Payer: Medicare PPO | Attending: Urology | Admitting: Physical Therapy

## 2023-02-05 DIAGNOSIS — R278 Other lack of coordination: Secondary | ICD-10-CM | POA: Insufficient documentation

## 2023-02-05 DIAGNOSIS — M6281 Muscle weakness (generalized): Secondary | ICD-10-CM | POA: Insufficient documentation

## 2023-02-05 NOTE — Therapy (Signed)
OUTPATIENT PHYSICAL THERAPY FEMALE PELVIC EVALUATION   Patient Name: Sara Lindsey MRN: [id number] DOB:28-Aug-1955, 67 y.o., female Today's Date: 02/05/2023  END OF SESSION:  PT End of Session - 02/05/23 1217     Visit Number 4    Date for PT Re-Evaluation 02/05/23    Authorization Time Period COHERE Approved 8 visits-01/08/2023-03/05/2023-Autho# [id number]  [employer] [id number]    Progress Note Due on Visit 4    PT Start Time 1200    PT Stop Time 1230    PT Time Calculation (min) 30 min    Activity Tolerance Patient tolerated treatment well    Behavior During Therapy [facility] for tasks assessed/performed                Past Medical History:  Diagnosis Date   Acid reflux    Anxiety    Depression    Elevated blood pressure reading without diagnosis of hypertension    Hyperlipidemia    Mild CAD    a. 11/2016 - cardiac catheterization today showing minor nonobstructive CAD with very mild stenosis at the LAD/diagonal bifurcation, otherwise normal coronary arteries. Normal LVEDP.   Obesity    Past Surgical History:  Procedure Laterality Date   ABDOMINAL HYSTERECTOMY     COLONOSCOPY N/A 05/15/2018   Procedure: COLONOSCOPY;  Surgeon: Sara Hippo, MD;  [facility];  Service: Endoscopy;  Laterality: N/A;  730   LEFT HEART CATH AND CORONARY ANGIOGRAPHY N/A 12/04/2016   Procedure: LEFT HEART CATH AND CORONARY ANGIOGRAPHY;  Surgeon: Sara Bollman, MD;  Location: [facility] INVASIVE CV LAB;  Service: Cardiovascular;  Laterality: N/A;   Patient Active Problem List   Diagnosis Date Noted   Pain in left knee 07/13/2021   Hematoma of lower leg 05/05/2020   Pain in right knee 04/14/2020   Subclinical hyperthyroidism 10/10/2019   Abnormal thyroid blood test 09/10/2019   History of colonic polyps 01/10/2018   Class 1 obesity due to excess calories with serious comorbidity and body mass index (BMI) of 32.0 to 32.9 in adult 12/04/2016   Elevated blood pressure reading without diagnosis  of hypertension 12/04/2016   Mild CAD 12/04/2016   Chest pain at rest    Mixed hyperlipidemia 12/02/2016    PCP: Sara Found, MD PCP - General  REFERRING PROVIDER: Bjorn Pippin, MD Ref Provider  REFERRING DIAG: R10.2 (ICD-10-CM) - Pelvic pain in female   THERAPY DIAG:  Muscle weakness (generalized)  Other lack of coordination  Rationale for Evaluation and Treatment: Rehabilitation  ONSET DATE: 07/2022  SUBJECTIVE:  SUBJECTIVE STATEMENT: Pt reports that she is doing pretty good. She had to cancel last week d/t sickness. Pt reports that she is about 85% improved. She does not have leakage. Pain is much better. Has days when she does not have pain. Still has some pressure. PT has been helpful.     Pt reports that her pain is better, frequency is better, frequency is less.    Pt reports that she is doing well, pain is a little better. Frequency is better. Sara Lindsey reports that she hopes that she is doing her exercises right.    Pt reports that she has had this pain about 4-5 months ago. She said it felt like ovulation. She had a hysterectomy in 1996.  Pt reports that when Dr Sara Lindsey did her pelvic exam, he aggravated it.   At night when she is on the back, it hurts more, when she rolls over.  Pt reports that she has been picking up her grandson and they have been remodeling, picking up boxes.    Fluid intake: Yes: when she drinks water, she can urinate every 30 mins    PAIN:  Are you having pain? Yes NPRS scale: 4-5/10- uncomfortable Pain location: Internal and Vaginal  Pain type: dull Pain description: constant   Aggravating factors: rolling over  Relieving factors: advil, sitting in a recliner  PRECAUTIONS: none  RED FLAGS: None   WEIGHT BEARING RESTRICTIONS: No  FALLS:  Has patient  fallen in last 6 months? No  LIVING ENVIRONMENT: Lives with: lives with their spouse Lives in: House/apartment Stairs: No Has following equipment at home: None  OCCUPATION: retired  PLOF: Independent  PATIENT GOALS: to have reduced abdominal/ pelvic pain  PERTINENT HISTORY:   Sexual abuse: No  BOWEL MOVEMENT: Pain with bowel movement: No Type of bowel movement:Strain No Fully empty rectum: Yes: - but sometimes constipated Leakage: No Pads: No Fiber supplement: No  URINATION: Pain with urination: No Fully empty bladder: No especially at night, has to sit there Stream: Strong Urgency: Yes:   Frequency: yes, gets up at night Leakage:  no Pads: No  INTERCOURSE: Pain with intercourse: does not have intercourse  PREGNANCY: Vaginal deliveries 2 Tearing episiotomy C-section deliveries 0 Currently pregnant No  PROLAPSE: Cystocele Dr Sara Lindsey saw   OBJECTIVE:  Note: Objective measures were completed at Evaluation unless otherwise noted.  COGNITION: Overall cognitive status: Within functional limits for tasks assessed     SENSATION: Light touch: Appears intact Proprioception: Appears intact   POSTURE: rounded shoulders, forward head, decreased lumbar lordosis, and posterior pelvic tilt  PELVIC ALIGNMENT: posterior pelvic tilt present  LUMBARAROM/PROM:  A/PROM A/PROM  eval  Flexion Grossly WFL  Extension   Right lateral flexion   Left lateral flexion   Right rotation   Left rotation    (Blank rows = not tested)  LOWER EXTREMITY ROM:  Passive ROM Right eval Left eval  Hip flexion [facility]  Hip extension    Hip abduction    Hip adduction [facility]  Hip internal rotation    Hip external rotation    Knee flexion    Knee extension    Ankle dorsiflexion    Ankle plantarflexion    Ankle inversion    Ankle eversion     (Blank rows = not tested)  LOWER EXTREMITY MMT:  MMT Right eval Left eval  Hip flexion 4/5 4/5  Hip extension    Hip abduction     Hip adduction 4/5 4/5  PALPATION:  General  [facility]                External Perineal Exam [facility]                             Internal Pelvic Floor - no tenderness or tightness  Patient confirms identification and approves PT to assess internal pelvic floor and treatment Yes  PELVIC MMT:   MMT eval  Vaginal 1/5  Internal Anal Sphincter   External Anal Sphincter   Puborectalis   Diastasis Recti Low abdominal tone  (Blank rows = not tested)        TONE: low  PROLAPSE: Yes- anterior and posterior wall laxity noticed, stool in rectum   TODAY'S TREATMENT:                                                                                                                              DATE:  02/05/23     Neuro reed- hip adduction with ball with TRANSVERSE ABDOMINIS BREATH  Ball press with TRANSVERSE ABDOMINIS BREATH - supine and seated STS with #10 with TRANSVERSE ABDOMINIS BREATH   Squat to table #20 KB Rowing with TB Bridging with TRANSVERSE ABDOMINIS BREATH Bird dog STS with PF contraction and TRANSVERSE ABDOMINIS BREATH on descent KB dead lift #20           -    PATIENT EDUCATION:  Education details: pt educated on relevant anatomy, exercises, pressure management, HEP and posture Person educated: Patient Education method: Explanation and Handouts Education comprehension: verbalized understanding and needs further education  HOME EXERCISE PROGRAM: [id number]  ASSESSMENT:  CLINICAL IMPRESSION: Pt reports that feeling of heaviness is definitely better, gets up 2x at night, is not holding her breath with picking up grandchildren. Reports that she is improving and PT has been very helpful.  Watches her caffeine, drinking more water. Stops drinking at 7 pm., goes to bed at 11 pm.      Pt progressing well, has been consistent with her HEP, feeling progress. Gets up at night 1 time, decreasing drinking at night. Reported that she does not want surgery. Has not ever  had any leakage with cough. Reported  that she is understanding pressure management better now. Pressure management exercises seemed better today.   Pt did well with her exercises today, tends to breathe the opposite and req VC's, TC's and explanation for good form and pressure management and breathing strategies to engage her core.      Patient is a 67 y.o. female who was seen today for physical therapy evaluation and treatment for pelvic pain and cystocele. She presents with anterior vaginal wall laxity, posterior vaginal wall laxity with stool in rectum, decreased coordination of breathing and pelvic floor, decreased awareness of her pelvic floor, abdominal weakness, poor posture, upper chest breathing and breath holding strategies. She did well today with internal assessment, education, exercises and will benefit  from physical therapy to reduce pain and improve voiding. Her main complaint is low abdominal pain, pressure and heaviness and incomplete bladder emptying, urinary frequency and nocturia.   OBJECTIVE IMPAIRMENTS: decreased coordination, decreased knowledge of condition, difficulty walking, decreased ROM, decreased strength, impaired tone, and pain.   ACTIVITY LIMITATIONS: carrying, lifting, standing, sleeping, and caring for others  PARTICIPATION LIMITATIONS: cleaning  PERSONAL FACTORS: Time since onset of injury/illness/exacerbation are also affecting patient's functional outcome.   REHAB POTENTIAL: Good  CLINICAL DECISION MAKING: Stable/uncomplicated  EVALUATION COMPLEXITY: Moderate   GOALS: Goals reviewed with patient? Yes  SHORT TERM GOALS: Target date: 02/05/2023    Patient will report reduced pressure and heaviness in their pelvic floor to maximum 3/10 for at least 4-6 hours/day (10  being the worst)  Baseline: Goal status: 5/10 pressure  2.  Patient will report maximum 3/10 abdominal/ pelvic pain to improve mobility and quality of life  Baseline:  Goal status:  INITIAL  3. Pt will be independent and compliant with HEP and perform all exercises correctly  Baseline:  Goal status: INITIAL   LONG TERM GOALS: Target date: 03/05/2023    Pt will be independent with advanced HEP.   Baseline:  Goal status: INITIAL  2.  Pt will be independent with the knack, urge suppression technique, and double voiding in order to improve bladder habits and improve quality of life Baseline:  Goal status: INITIAL  3.  Pt will be able pick up her grandson without increased pain Baseline:  Goal status: INITIAL  4.  Pt will have reduced frequency or urination at night to max 1 time in order to improve quality of life.  Baseline:  Goal status: INITIAL   PLAN:  PT FREQUENCY: 1-2x/week  PT DURATION:  8 sessions  PLANNED INTERVENTIONS: 97110-Therapeutic exercises, 97530- Therapeutic activity, 97112- Neuromuscular re-education, 97535- Self Care, 16109- Manual therapy, [phone]- Electrical stimulation (manual), Dry Needling, Joint mobilization, Joint manipulation, Spinal manipulation, Spinal mobilization, Scar mobilization, and Biofeedback  PLAN FOR NEXT SESSION: continue with pressure management and core exercises for cystocele   Ayiana Winslett, PT 02/05/23 12:28 PM

## 2023-02-16 ENCOUNTER — Ambulatory Visit: Payer: Medicare PPO | Admitting: Physical Therapy

## 2023-02-16 ENCOUNTER — Encounter: Payer: Medicare PPO | Admitting: Physical Therapy

## 2023-02-16 DIAGNOSIS — R278 Other lack of coordination: Secondary | ICD-10-CM

## 2023-02-16 DIAGNOSIS — M6281 Muscle weakness (generalized): Secondary | ICD-10-CM

## 2023-02-16 NOTE — Therapy (Signed)
OUTPATIENT PHYSICAL THERAPY FEMALE PELVIC EVALUATION   Patient Name: Sara Lindsey MRN: [id number] DOB:March 26, 1956, 67 y.o., female Today's Date: 02/16/2023  END OF SESSION:  PT End of Session - 02/16/23 1039     Visit Number 5    Date for PT Re-Evaluation 02/05/23    Authorization Time Period COHERE Approved 8 visits-01/08/2023-03/05/2023-Autho# [id number]  [employer] [id number]    Progress Note Due on Visit 4    PT Start Time 1019    PT Stop Time 1100    PT Time Calculation (min) 41 min    Activity Tolerance Patient tolerated treatment well    Behavior During Therapy [facility] for tasks assessed/performed                 Past Medical History:  Diagnosis Date   Acid reflux    Anxiety    Depression    Elevated blood pressure reading without diagnosis of hypertension    Hyperlipidemia    Mild CAD    a. 11/2016 - cardiac catheterization today showing minor nonobstructive CAD with very mild stenosis at the LAD/diagonal bifurcation, otherwise normal coronary arteries. Normal LVEDP.   Obesity    Past Surgical History:  Procedure Laterality Date   ABDOMINAL HYSTERECTOMY     COLONOSCOPY N/A 05/15/2018   Procedure: COLONOSCOPY;  Surgeon: Malissa Hippo, MD;  [facility];  Service: Endoscopy;  Laterality: N/A;  730   LEFT HEART CATH AND CORONARY ANGIOGRAPHY N/A 12/04/2016   Procedure: LEFT HEART CATH AND CORONARY ANGIOGRAPHY;  Surgeon: Tonny Bollman, MD;  Location: [facility] INVASIVE CV LAB;  Service: Cardiovascular;  Laterality: N/A;   Patient Active Problem List   Diagnosis Date Noted   Pain in left knee 07/13/2021   Hematoma of lower leg 05/05/2020   Pain in right knee 04/14/2020   Subclinical hyperthyroidism 10/10/2019   Abnormal thyroid blood test 09/10/2019   History of colonic polyps 01/10/2018   Class 1 obesity due to excess calories with serious comorbidity and body mass index (BMI) of 32.0 to 32.9 in adult 12/04/2016   Elevated blood pressure reading without  diagnosis of hypertension 12/04/2016   Mild CAD 12/04/2016   Chest pain at rest    Mixed hyperlipidemia 12/02/2016    PCP: Assunta Found, MD PCP - General  REFERRING PROVIDER: Bjorn Pippin, MD Ref Provider  REFERRING DIAG: R10.2 (ICD-10-CM) - Pelvic pain in female   THERAPY DIAG:  Muscle weakness (generalized)  Other lack of coordination  Rationale for Evaluation and Treatment: Rehabilitation  ONSET DATE: 07/2022  SUBJECTIVE:  SUBJECTIVE STATEMENT: Pt reports that she is feeling better.  She got up only one time last night, instead of 2.  She feels about 70 % improved. Pain is minimal. Can roll over much better.  She got her mom to do these exercises, her mom is getting up less now too.  She reports that it's so much easier to lift Joelene Millin too with transverse abdominis breath.      Fluid intake: Yes: when she drinks water, she can urinate every 30 mins    PAIN:  Are you having pain? Yes NPRS scale: 4-5/10- uncomfortable Pain location: Internal and Vaginal  Pain type: dull Pain description: constant   Aggravating factors: rolling over  Relieving factors: advil, sitting in a recliner  PRECAUTIONS: none  RED FLAGS: None   WEIGHT BEARING RESTRICTIONS: No  FALLS:  Has patient fallen in last 6 months? No  LIVING ENVIRONMENT: Lives with: lives with their spouse Lives in: House/apartment Stairs: No Has following equipment at home: None  OCCUPATION: retired  PLOF: Independent  PATIENT GOALS: to have reduced abdominal/ pelvic pain  PERTINENT HISTORY:   Sexual abuse: No  BOWEL MOVEMENT: Pain with bowel movement: No Type of bowel movement:Strain No Fully empty rectum: Yes: - but sometimes constipated Leakage: No Pads: No Fiber supplement: No  URINATION: Pain with  urination: No Fully empty bladder: No especially at night, has to sit there Stream: Strong Urgency: Yes:   Frequency: yes, gets up at night Leakage:  no Pads: No  INTERCOURSE: Pain with intercourse: does not have intercourse  PREGNANCY: Vaginal deliveries 2 Tearing episiotomy C-section deliveries 0 Currently pregnant No  PROLAPSE: Cystocele Dr Wilson Singer saw   OBJECTIVE:  Note: Objective measures were completed at Evaluation unless otherwise noted.  COGNITION: Overall cognitive status: Within functional limits for tasks assessed     SENSATION: Light touch: Appears intact Proprioception: Appears intact   POSTURE: rounded shoulders, forward head, decreased lumbar lordosis, and posterior pelvic tilt  PELVIC ALIGNMENT: posterior pelvic tilt present  LUMBARAROM/PROM:  A/PROM A/PROM  eval  Flexion Grossly WFL  Extension   Right lateral flexion   Left lateral flexion   Right rotation   Left rotation    (Blank rows = not tested)  LOWER EXTREMITY ROM:  Passive ROM Right eval Left eval  Hip flexion [facility]  Hip extension    Hip abduction    Hip adduction [facility]  Hip internal rotation    Hip external rotation    Knee flexion    Knee extension    Ankle dorsiflexion    Ankle plantarflexion    Ankle inversion    Ankle eversion     (Blank rows = not tested)  LOWER EXTREMITY MMT:  MMT Right eval Left eval  Hip flexion 4/5 4/5  Hip extension    Hip abduction    Hip adduction 4/5 4/5  PALPATION:   General  WFL                External Perineal Exam [facility]                             Internal Pelvic Floor - no tenderness or tightness  Patient confirms identification and approves PT to assess internal pelvic floor and treatment Yes  PELVIC MMT:   MMT eval  Vaginal 1/5  Internal Anal Sphincter   External Anal Sphincter   Puborectalis   Diastasis Recti Low abdominal tone  (Blank  rows = not tested)        TONE: low  PROLAPSE: Yes- anterior and  posterior wall laxity noticed, stool in rectum   TODAY'S TREATMENT:                                                                                                                              DATE:  02/16/23     Neuro reed- hip adduction with ball with transverse abdominis breath  Ball press with transverse abdominis breath  supine and seated STS with #10 with transverse abdominis breath   Squat to table #20 KB Rowing with TB Bridging with transverse abdominis breath  Bird dog STS with PF contraction and transverse abdominis breath KB dead lift #20            -    PATIENT EDUCATION:  Education details: pt educated on relevant anatomy, exercises, pressure management, HEP and posture Person educated: Patient Education method: Explanation and Handouts Education comprehension: verbalized understanding and needs further education  HOME EXERCISE PROGRAM: [id number]  ASSESSMENT:  CLINICAL IMPRESSION: Pt progressing well, much less nocturia. Discussed vaginal dryness. Gave pt samples of good clean love vaginal moisturizers. Pt would likely benefit from some vaginal estrogen.  Progressing well with exercises, much improved coordination with breath.    .    OBJECTIVE IMPAIRMENTS: decreased coordination, decreased knowledge of condition, difficulty walking, decreased ROM, decreased strength, impaired tone, and pain.   ACTIVITY LIMITATIONS: carrying, lifting, standing, sleeping, and caring for others  PARTICIPATION LIMITATIONS: cleaning  PERSONAL FACTORS: Time since onset of injury/illness/exacerbation are also affecting patient's functional outcome.   REHAB POTENTIAL: Good  CLINICAL DECISION MAKING: Stable/uncomplicated  EVALUATION COMPLEXITY: Moderate   GOALS: Goals reviewed with patient? Yes  SHORT TERM GOALS: Target date: 02/05/2023    Patient will report reduced pressure and heaviness in their pelvic floor to maximum 3/10 for at least 4-6 hours/day (10   being the worst)  Baseline: Goal status: 5/10 pressure  2.  Patient will report maximum 3/10 abdominal/ pelvic pain to improve mobility and quality of life  Baseline:  Goal status: INITIAL  3. Pt will be independent and compliant with HEP and perform all exercises correctly  Baseline:  Goal status: INITIAL   LONG TERM GOALS: Target date: 03/05/2023    Pt will be independent with advanced HEP.   Baseline:  Goal status: INITIAL  2.  Pt will be independent with the knack, urge suppression technique, and double voiding in order to improve bladder habits and improve quality of life Baseline:  Goal status: INITIAL  3.  Pt will be able pick up her grandson without increased pain Baseline:  Goal status: INITIAL  4.  Pt will have reduced frequency or urination at night to max 1 time in order to improve quality of life.  Baseline:  Goal status: INITIAL   PLAN:  PT FREQUENCY: 1-2x/week  PT DURATION:  8 sessions  PLANNED INTERVENTIONS: 97110-Therapeutic exercises, 97530-  Therapeutic activity, [id number]- Neuromuscular re-education, [phone]- Self Care, 62130- Manual therapy, [phone]- Electrical stimulation (manual), Dry Needling, Joint mobilization, Joint manipulation, Spinal manipulation, Spinal mobilization, Scar mobilization, and Biofeedback  PLAN FOR NEXT SESSION: continue with pressure management and core exercises for cystocele   Jeana Kersting, PT 02/16/23 10:40 AM

## 2023-02-20 ENCOUNTER — Encounter: Payer: Self-pay | Admitting: Physical Therapy

## 2023-03-05 ENCOUNTER — Ambulatory Visit: Payer: Medicare PPO | Attending: Urology | Admitting: Physical Therapy

## 2023-03-05 ENCOUNTER — Encounter: Payer: Self-pay | Admitting: Physical Therapy

## 2023-03-05 DIAGNOSIS — R278 Other lack of coordination: Secondary | ICD-10-CM

## 2023-03-05 DIAGNOSIS — M6281 Muscle weakness (generalized): Secondary | ICD-10-CM

## 2023-03-05 NOTE — Therapy (Signed)
OUTPATIENT PHYSICAL THERAPY FEMALE PELVIC TREATMENT   Patient Name: Sara Lindsey MRN: [id number] DOB:02/05/1956, 68 y.o., female Today's Date: 03/05/2023  END OF SESSION:  PT End of Session - 03/05/23 1513     Visit Number 6    Date for PT Re-Evaluation 02/05/23    Authorization Time Period COHERE Approved 8 visits-01/08/2023-03/05/2023-Autho# [id number]  [employer] [id number]    Progress Note Due on Visit 4    PT Start Time 0245    PT Stop Time 0330    PT Time Calculation (min) 45 min    Activity Tolerance Patient tolerated treatment well    Behavior During Therapy [facility] for tasks assessed/performed                  Past Medical History:  Diagnosis Date   Acid reflux    Anxiety    Depression    Elevated blood pressure reading without diagnosis of hypertension    Hyperlipidemia    Mild CAD    a. 11/2016 - cardiac catheterization today showing minor nonobstructive CAD with very mild stenosis at the LAD/diagonal bifurcation, otherwise normal coronary arteries. Normal LVEDP.   Obesity    Past Surgical History:  Procedure Laterality Date   ABDOMINAL HYSTERECTOMY     COLONOSCOPY N/A 05/15/2018   Procedure: COLONOSCOPY;  Surgeon: Malissa Hippo, MD;  [facility];  Service: Endoscopy;  Laterality: N/A;  730   LEFT HEART CATH AND CORONARY ANGIOGRAPHY N/A 12/04/2016   Procedure: LEFT HEART CATH AND CORONARY ANGIOGRAPHY;  Surgeon: Tonny Bollman, MD;  Location: [facility] INVASIVE CV LAB;  Service: Cardiovascular;  Laterality: N/A;   Patient Active Problem List   Diagnosis Date Noted   Pain in left knee 07/13/2021   Hematoma of lower leg 05/05/2020   Pain in right knee 04/14/2020   Subclinical hyperthyroidism 10/10/2019   Abnormal thyroid blood test 09/10/2019   History of colonic polyps 01/10/2018   Class 1 obesity due to excess calories with serious comorbidity and body mass index (BMI) of 32.0 to 32.9 in adult 12/04/2016   Elevated blood pressure reading without  diagnosis of hypertension 12/04/2016   Mild CAD 12/04/2016   Chest pain at rest    Mixed hyperlipidemia 12/02/2016    PCP: Assunta Found, MD PCP - General  REFERRING PROVIDER: Bjorn Pippin, MD Ref Provider  REFERRING DIAG: R10.2 (ICD-10-CM) - Pelvic pain in female   THERAPY DIAG:  Muscle weakness (generalized)  Other lack of coordination  Rationale for Evaluation and Treatment: Rehabilitation  ONSET DATE: 07/2022  SUBJECTIVE:  SUBJECTIVE STATEMENT: Pt reports that she is feeling much better. This may be her last appt.   Fluid intake: when she drinks water, intervals are better, definitely longer than 30 mins PAIN:  Are you having pain? Very little NPRS scale: 2/10 Pain location: right groin Pain type: twinge Pain description:intermittent Aggravating factors: when she turns after she rolls over at night Relieving factors: not aggravating enough, goes away  PRECAUTIONS: none  RED FLAGS: None   WEIGHT BEARING RESTRICTIONS: No  FALLS:  Has patient fallen in last 6 months? No  LIVING ENVIRONMENT: Lives with: lives with their spouse Lives in: House/apartment Stairs: No Has following equipment at home: None  OCCUPATION: retired  PLOF: Independent  PATIENT GOALS: to have reduced abdominal/ pelvic pain  PERTINENT HISTORY:   Sexual abuse: No  BOWEL MOVEMENT: Pain with bowel movement: No Type of bowel movement:Strain No Fully empty rectum: yes Leakage: No Pads: No Fiber supplement: No  URINATION: Pain with urination: No Fully empty bladder: No especially at night, has to sit there Stream: Strong Urgency: Yes:   Frequency: sometimes 2x at night, sometimes 1x Leakage:  no Pads: No  INTERCOURSE: N/A  PREGNANCY: Vaginal deliveries 2 Tearing episiotomy C-section  deliveries 0 Currently pregnant No  PROLAPSE: Cystocele Dr Wilson Singer saw   OBJECTIVE:  Note: Objective measures were completed at Evaluation unless otherwise noted.  COGNITION: Overall cognitive status: Within functional limits for tasks assessed     SENSATION: Light touch: Appears intact Proprioception: Appears intact   POSTURE: rounded shoulders, forward head, decreased lumbar lordosis, and posterior pelvic tilt  PELVIC ALIGNMENT: posterior pelvic tilt present  LUMBARAROM/PROM:  A/PROM A/PROM  eval  Flexion Grossly WFL  Extension   Right lateral flexion   Left lateral flexion   Right rotation   Left rotation    (Blank rows = not tested)  LOWER EXTREMITY ROM:  Passive ROM Right eval Left eval  Hip flexion [name]  Hip extension    Hip abduction    Hip adduction [facility]  Hip internal rotation    Hip external rotation    Knee flexion    Knee extension    Ankle dorsiflexion    Ankle plantarflexion    Ankle inversion    Ankle eversion     (Blank rows = not tested)  LOWER EXTREMITY MMT:  MMT Right eval Left eval  Hip flexion 4/5 4/5  Hip extension    Hip abduction    Hip adduction 4/5 4/5  PALPATION:   General  WFL                External Perineal Exam [facility]                             Internal Pelvic Floor - no tenderness or tightness  Patient confirms identification and approves PT to assess internal pelvic floor and treatment Yes  PELVIC MMT:   MMT eval  Vaginal 3/5  Internal Anal Sphincter   External Anal Sphincter   Puborectalis   Diastasis Recti Low abdominal tone  (Blank rows = not tested)        TONE: low  PROLAPSE: Yes- TODAY'S TREATMENT:  DATE:  03/05/23     Neuro reed- hip adduction with ball with transverse abdominis breath  Ball press with transverse abdominis breath  supine and seated STS with #10  with transverse abdominis breath   Squat to table #20 KB Rowing with TB Bridging with transverse abdominis breath  Bird dog STS with PF contraction and transverse abdominis breath KB dead lift #20 Unilat ball press with transverse abdominis breath  -    PATIENT EDUCATION:  Education details: pt educated on relevant anatomy, exercises, pressure management, HEP and posture Person educated: Patient Education method: Chief Technology Officer Education comprehension: verbalized understanding and needs further education  HOME EXERCISE PROGRAM: [id number]  ASSESSMENT:  CLINICAL IMPRESSION: Pt has done well with her exercises, has much improved symptoms and is happy with her progress.  .    OBJECTIVE IMPAIRMENTS: decreased coordination, decreased knowledge of condition, difficulty walking, decreased ROM, decreased strength, impaired tone, and pain.   ACTIVITY LIMITATIONS: carrying, lifting, standing, sleeping, and caring for others  PARTICIPATION LIMITATIONS: cleaning  PERSONAL FACTORS: Time since onset of injury/illness/exacerbation are also affecting patient's functional outcome.   REHAB POTENTIAL: Good  CLINICAL DECISION MAKING: Stable/uncomplicated  EVALUATION COMPLEXITY: Moderate   GOALS: Goals reviewed with patient? Yes  SHORT TERM GOALS: Target date: 02/05/2023    Patient will report reduced pressure and heaviness in their pelvic floor to maximum 3/10 for at least 4-6 hours/day (10  being the worst)  Baseline: 5/10 Goal status: 2/10  2.  Patient will report maximum 3/10 abdominal/ pelvic pain to improve mobility and quality of life  Baseline: 5-6/10 Goal status: met  3. Pt will be independent and compliant with HEP and perform all exercises correctly  Baseline: no Goal status: met   LONG TERM GOALS: Target date: 03/05/2023    Pt will be independent with advanced HEP.   Baseline: no Goal status: met  2.  Pt will be independent with the knack, urge  suppression technique, and double voiding in order to improve bladder habits and improve quality of life Baseline: no Goal status: met  3.  Pt will be able pick up her grandson without increased pain Baseline: no Goal status: met  4.  Pt will have reduced frequency or urination at night to max 1 time in order to improve quality of life.  Baseline: no Goal status: partially met   PLAN:  PT FREQUENCY: 1-2x/week  PT DURATION:  8 sessions  PLANNED INTERVENTIONS: 97110-Therapeutic exercises, 97530- Therapeutic activity, 97112- Neuromuscular re-education, 97535- Self Care, 95621- Manual therapy, [phone]- Electrical stimulation (manual), Dry Needling, Joint mobilization, Joint manipulation, Spinal manipulation, Spinal mobilization, Scar mobilization, and Biofeedback  PLAN FOR NEXT SESSION: continue with pressure management and core exercises for cystocele   Yasha Tibbett, PT 03/05/23 3:16 PM   PHYSICAL THERAPY DISCHARGE SUMMARY  Visits from Start of Care: 01/22/23  Current functional level related to goals / functional outcomes: Decreased pelvic pain, urinary urgency and frequency and nocturia   Remaining deficits: Still gets up at night 2x at times, has 2/10 pain with rolling over at night   Education / Equipment: HEP, theraband, relevant anatomy   Patient agrees to discharge. Patient goals were met. Patient is being discharged due to being pleased with the current functional level. Loucille Takach, PT 03/05/23 3:19 PM

## 2023-03-13 DIAGNOSIS — R6889 Other general symptoms and signs: Secondary | ICD-10-CM | POA: Diagnosis not present

## 2023-03-13 DIAGNOSIS — J0100 Acute maxillary sinusitis, unspecified: Secondary | ICD-10-CM | POA: Diagnosis not present

## 2023-03-13 DIAGNOSIS — Z20828 Contact with and (suspected) exposure to other viral communicable diseases: Secondary | ICD-10-CM | POA: Diagnosis not present

## 2023-03-13 DIAGNOSIS — E6609 Other obesity due to excess calories: Secondary | ICD-10-CM | POA: Diagnosis not present

## 2023-03-13 DIAGNOSIS — Z6831 Body mass index (BMI) 31.0-31.9, adult: Secondary | ICD-10-CM | POA: Diagnosis not present

## 2023-04-05 ENCOUNTER — Ambulatory Visit: Payer: Medicare PPO | Admitting: Urology

## 2023-04-19 ENCOUNTER — Ambulatory Visit: Payer: Medicare PPO | Admitting: Urology

## 2023-04-19 VITALS — BP 102/64 | HR 66

## 2023-04-19 DIAGNOSIS — R102 Pelvic and perineal pain: Secondary | ICD-10-CM

## 2023-04-19 DIAGNOSIS — R350 Frequency of micturition: Secondary | ICD-10-CM | POA: Diagnosis not present

## 2023-04-19 LAB — MICROSCOPIC EXAMINATION: Bacteria, UA: NONE SEEN

## 2023-04-19 LAB — URINALYSIS, ROUTINE W REFLEX MICROSCOPIC
Bilirubin, UA: NEGATIVE
Glucose, UA: NEGATIVE
Ketones, UA: NEGATIVE
Nitrite, UA: NEGATIVE
Protein,UA: NEGATIVE
RBC, UA: NEGATIVE
Specific Gravity, UA: 1.01 (ref 1.005–1.030)
Urobilinogen, Ur: 0.2 mg/dL (ref 0.2–1.0)
pH, UA: 6 (ref 5.0–7.5)

## 2023-04-19 NOTE — Progress Notes (Signed)
Subjective: 1. Urinary frequency     04/19/23: Sara Lindsey returns today in f/u.  She has been getting PT for her pelvic pain with the last visit on 03/05/23.  She reports resolution of the pain.  Her voiding symptoms have improved as well.  Her UA has 6-10 WBC's.    12/29/22: Sara Lindsey is a 68 yo female who presents with a 3-4 month history of pelvic pain.  The pain is in both groins.  The pain has lessened.  It is worse on the right when she turns over while sleeping at night.  It is intermittent and a dull ache,  She had a negative pelvic US recent after seeing her Gyn.  She has an occasionally reduced stream.  She has some frequency and nocturia x 2 but no urgency or incontinence.  She has had no hematuria.  She has had no stones or recent UTI's.  She has had a hysterectomy but no GU surgery.   She has no GI complaints. She has no bulging in the groins.  ROS:  Review of Systems  Constitutional:  Positive for malaise/fatigue.  Cardiovascular:  Positive for leg swelling.  Gastrointestinal:  Positive for heartburn and nausea.  Musculoskeletal:  Positive for joint pain.    Allergies  Allergen Reactions   Bee Venom    Azithromycin Rash   Flexeril [Cyclobenzaprine] Palpitations and Other (See Comments)    Patient reports that it also caused dizziness    Past Medical History:  Diagnosis Date   Acid reflux    Anxiety    Depression    Elevated blood pressure reading without diagnosis of hypertension    Hyperlipidemia    Mild CAD    a. 11/2016 - cardiac catheterization today showing minor nonobstructive CAD with very mild stenosis at the LAD/diagonal bifurcation, otherwise normal coronary arteries. Normal LVEDP.   Obesity     Past Surgical History:  Procedure Laterality Date   ABDOMINAL HYSTERECTOMY     COLONOSCOPY N/A 05/15/2018   Procedure: COLONOSCOPY;  Surgeon: Malissa Hippo, MD;  Location: [facility];  Service: Endoscopy;  Laterality: N/A;  730   LEFT HEART CATH AND CORONARY  ANGIOGRAPHY N/A 12/04/2016   Procedure: LEFT HEART CATH AND CORONARY ANGIOGRAPHY;  Surgeon: Tonny Bollman, MD;  Location: [facility] INVASIVE CV LAB;  Service: Cardiovascular;  Laterality: N/A;    Social History   Socioeconomic History   Marital status: Married    Spouse name: Not on file   Number of children: Not on file   Years of education: Not on file   Highest education level: Not on file  Occupational History   Not on file  Tobacco Use   Smoking status: Never   Smokeless tobacco: Never  Vaping Use   Vaping status: Never Used  Substance and Sexual Activity   Alcohol use: No   Drug use: No   Sexual activity: Yes    Birth control/protection: Surgical  Other Topics Concern   Not on file  Social History Narrative   Not on file   Social Drivers of Health   Financial Resource Strain: Not on file  Food Insecurity: No Food Insecurity (03/10/2019)   Received from [facility], [facility]   Hunger Vital Sign    Worried About Running Out of Food in the Last Year: Never true    Ran Out of Food in the Last Year: Never true  Transportation Needs: No Transportation Needs (03/10/2019)   Received from [facility], [facility]  Care   PRAPARE - Transportation    Lack of Transportation (Medical): No    Lack of Transportation (Non-Medical): No  Physical Activity: Insufficiently Active (10/11/2022)   Received from [facility]   Exercise Vital Sign    Days of Exercise per Week: 2 days    Minutes of Exercise per Session: 30 min  Stress: No Stress Concern Present (10/11/2022)   Received from [facility] - Occupational Stress Questionnaire    Feeling of Stress : Only a little  Social Connections: Not on file  Intimate Partner Violence: Not At Risk (10/11/2022)   [facility]   Humiliation, Afraid, Rape, and Kick questionnaire    Fear of Current or Ex-Partner: No    Emotionally Abused: No    Physically Abused: No     Sexually Abused: No    Family History  Problem Relation Age of Onset   Arrhythmia Father    Heart attack Maternal Grandmother 5       died    Anti-infectives: Anti-infectives (From admission, onward)    None       Current Outpatient Medications  Medication Sig Dispense Refill   acetaminophen (TYLENOL) 500 MG tablet Take 1,000 mg by mouth every 6 (six) hours as needed for moderate pain or headache.     aspirin 81 MG EC tablet Take 1 tablet (81 mg total) by mouth daily. (Patient taking differently: Take 81 mg by mouth 2 (two) times a week.)     atorvastatin (LIPITOR) 20 MG tablet Take 20 mg by mouth at bedtime.     benzonatate (TESSALON) 100 MG capsule Take 1 capsule (100 mg total) by mouth 3 (three) times daily as needed for cough. Do not take with alcohol or while driving or operating heavy machinery.  May cause drowsiness. 21 capsule 0   citalopram (CELEXA) 20 MG tablet Take 20 mg by mouth at bedtime.     fluticasone (FLONASE) 50 MCG/ACT nasal spray Place 2 sprays into both nostrils daily.     levocetirizine (XYZAL) 5 MG tablet Take 5 mg by mouth every evening.     Omega-3 Fatty Acids (OMEGA 3 PO) Take 1 capsule by mouth daily.     omeprazole (PRILOSEC) 20 MG capsule Take 1 capsule (20 mg total) by mouth daily. 30 capsule 0   No current facility-administered medications for this visit.     Objective: Vital signs in last 24 hours: BP 102/64   Pulse 66   Intake/Output from previous day: No intake/output data recorded. Intake/Output this shift: @IOTHISSHIFT @   Physical Exam Vitals reviewed.  Constitutional:      Appearance: Normal appearance.  Neurological:     Mental Status: She is alert.     Lab Results:  Results for orders placed or performed in visit on 04/19/23 (from the past 24 hours)  Urinalysis, Routine w reflex microscopic     Status: Abnormal   Collection Time: 04/19/23  2:14 PM  Result Value Ref Range   Specific Gravity, UA 1.010 1.005 - 1.030    pH, UA 6.0 5.0 - 7.5   Color, UA Yellow Yellow   Appearance Ur Clear Clear   Leukocytes,UA 1+ (A) Negative   Protein,UA Negative Negative/Trace   Glucose, UA Negative Negative   Ketones, UA Negative Negative   RBC, UA Negative Negative   Bilirubin, UA Negative Negative   Urobilinogen, Ur 0.2 0.2 - 1.0 mg/dL   Nitrite, UA Negative Negative  Microscopic Examination See below:    Narrative   Performed at:  [address] - [facility] [address] [place], [place]  [id number] Lab Director: Chinita Pester MT, Phone:  [phone]  Microscopic Examination     Status: Abnormal   Collection Time: 04/19/23  2:14 PM   Urine  Result Value Ref Range   WBC, UA 6-10 (A) 0 - 5 /hpf   RBC, Urine 0-2 0 - 2 /hpf   Epithelial Cells (non renal) 0-10 0 - 10 /hpf   Bacteria, UA None seen None seen/Few   Narrative   Performed at:  [address] - [facility]  [address] [place], [place]  [id number] Lab Director: Chinita Pester MT, Phone:  [phone]     BMET No results for input(s): "NA", "K", "CL", "CO2", "GLUCOSE", "BUN", "CREATININE", "CALCIUM" in the last 72 hours. PT/INR No results for input(s): "LABPROT", "INR" in the last 72 hours. ABG No results for input(s): "PHART", "HCO3" in the last 72 hours.  Invalid input(s): "PCO2", "PO2"  UA reviewed.  Studies/Results: No results found.   Assessment/Plan: Pelvic pain that localized to the levator complex.  She has had an excellent response to PT.  She will return prn.  Cystocele with vaginal atrophy.  This is not symptomatic.  Pyuria with no UTI symptoms and resolution of the  frequency and nocturia.   No orders of the defined types were placed in this encounter.    Orders Placed This Encounter  Procedures   Microscopic Examination   Urinalysis, Routine w reflex microscopic     Return if symptoms worsen or fail to improve.    CC: Dr. Assunta Found and  Dr. Silas Flood     Bjorn Pippin 04/20/2023

## 2023-06-14 ENCOUNTER — Ambulatory Visit: Payer: Medicare PPO | Admitting: Urology

## 2023-07-16 DIAGNOSIS — Z6832 Body mass index (BMI) 32.0-32.9, adult: Secondary | ICD-10-CM | POA: Diagnosis not present

## 2023-07-16 DIAGNOSIS — R6889 Other general symptoms and signs: Secondary | ICD-10-CM | POA: Diagnosis not present

## 2023-07-16 DIAGNOSIS — Z20828 Contact with and (suspected) exposure to other viral communicable diseases: Secondary | ICD-10-CM | POA: Diagnosis not present

## 2023-07-16 DIAGNOSIS — E6609 Other obesity due to excess calories: Secondary | ICD-10-CM | POA: Diagnosis not present

## 2023-07-16 DIAGNOSIS — J45909 Unspecified asthma, uncomplicated: Secondary | ICD-10-CM | POA: Diagnosis not present

## 2023-07-16 DIAGNOSIS — J0100 Acute maxillary sinusitis, unspecified: Secondary | ICD-10-CM | POA: Diagnosis not present

## 2023-08-31 ENCOUNTER — Other Ambulatory Visit (HOSPITAL_COMMUNITY): Payer: Self-pay | Admitting: Obstetrics and Gynecology

## 2023-08-31 DIAGNOSIS — Z1231 Encounter for screening mammogram for malignant neoplasm of breast: Secondary | ICD-10-CM

## 2023-10-08 ENCOUNTER — Ambulatory Visit (HOSPITAL_COMMUNITY)
Admission: RE | Admit: 2023-10-08 | Discharge: 2023-10-08 | Disposition: A | Discharge status: Home / Self Care | Source: Ambulatory Visit | Attending: Obstetrics and Gynecology | Admitting: Obstetrics and Gynecology

## 2023-10-08 ENCOUNTER — Encounter (HOSPITAL_COMMUNITY): Payer: Self-pay

## 2023-10-08 DIAGNOSIS — Z1231 Encounter for screening mammogram for malignant neoplasm of breast: Secondary | ICD-10-CM | POA: Diagnosis not present

## 2023-11-21 DIAGNOSIS — Z20828 Contact with and (suspected) exposure to other viral communicable diseases: Secondary | ICD-10-CM | POA: Diagnosis not present

## 2023-11-21 DIAGNOSIS — Z6832 Body mass index (BMI) 32.0-32.9, adult: Secondary | ICD-10-CM | POA: Diagnosis not present

## 2023-11-21 DIAGNOSIS — E6609 Other obesity due to excess calories: Secondary | ICD-10-CM | POA: Diagnosis not present

## 2023-11-21 DIAGNOSIS — J0100 Acute maxillary sinusitis, unspecified: Secondary | ICD-10-CM | POA: Diagnosis not present

## 2023-11-21 DIAGNOSIS — R6889 Other general symptoms and signs: Secondary | ICD-10-CM | POA: Diagnosis not present
